# Patient Record
Sex: Male | Born: 1995 | Race: Black or African American | Hispanic: No | Marital: Single | Smoking: Former smoker
Health system: Southern US, Community
[De-identification: ages and names within clinical notes are randomized; demographics above are authoritative.]

## PROBLEM LIST (undated history)

## (undated) DIAGNOSIS — S62309A Unspecified fracture of unspecified metacarpal bone, initial encounter for closed fracture: Secondary | ICD-10-CM

## (undated) DIAGNOSIS — Z21 Asymptomatic human immunodeficiency virus [HIV] infection status: Secondary | ICD-10-CM

## (undated) DIAGNOSIS — B20 Human immunodeficiency virus [HIV] disease: Secondary | ICD-10-CM

## (undated) HISTORY — PX: WISDOM TOOTH EXTRACTION: SHX21

---
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
## 2014-01-24 ENCOUNTER — Emergency Department (HOSPITAL_COMMUNITY)
Admission: EM | Admit: 2014-01-24 | Discharge: 2014-01-24 | Disposition: A | Discharge status: Home / Self Care | Payer: BC Managed Care – PPO | Attending: Emergency Medicine | Admitting: Emergency Medicine

## 2014-01-24 ENCOUNTER — Encounter (HOSPITAL_COMMUNITY): Payer: Self-pay | Admitting: Emergency Medicine

## 2014-01-24 DIAGNOSIS — Z79899 Other long term (current) drug therapy: Secondary | ICD-10-CM | POA: Insufficient documentation

## 2014-01-24 DIAGNOSIS — Z791 Long term (current) use of non-steroidal anti-inflammatories (NSAID): Secondary | ICD-10-CM | POA: Insufficient documentation

## 2014-01-24 DIAGNOSIS — Z862 Personal history of diseases of the blood and blood-forming organs and certain disorders involving the immune mechanism: Secondary | ICD-10-CM | POA: Diagnosis not present

## 2014-01-24 DIAGNOSIS — Z21 Asymptomatic human immunodeficiency virus [HIV] infection status: Secondary | ICD-10-CM | POA: Insufficient documentation

## 2014-01-24 DIAGNOSIS — L0231 Cutaneous abscess of buttock: Secondary | ICD-10-CM | POA: Diagnosis present

## 2014-01-24 DIAGNOSIS — B20 Human immunodeficiency virus [HIV] disease: Secondary | ICD-10-CM

## 2014-01-24 DIAGNOSIS — L0501 Pilonidal cyst with abscess: Secondary | ICD-10-CM | POA: Insufficient documentation

## 2014-01-24 DIAGNOSIS — F17200 Nicotine dependence, unspecified, uncomplicated: Secondary | ICD-10-CM | POA: Diagnosis not present

## 2014-01-24 DIAGNOSIS — Z8639 Personal history of other endocrine, nutritional and metabolic disease: Secondary | ICD-10-CM | POA: Insufficient documentation

## 2014-01-24 DIAGNOSIS — L0591 Pilonidal cyst without abscess: Secondary | ICD-10-CM

## 2014-01-24 HISTORY — DX: Human immunodeficiency virus (HIV) disease: B20

## 2014-01-24 HISTORY — DX: Asymptomatic human immunodeficiency virus (hiv) infection status: Z21

## 2014-01-24 NOTE — ED Provider Notes (Signed)
CSN: [id number]     Arrival date & time 01/24/14  [id number] History   First MD Initiated Contact with Patient 01/24/14 [phone]     Chief Complaint  Patient presents with  . Abscess   Patient is a 18 y.o. male presenting with abscess. The history is provided by the patient. No language interpreter was used.  Abscess Location:  Ano-genital Ano-genital abscess location:  Gluteal cleft Size:  1 cm x 1.5 cm Abscess quality: painful   Abscess quality: not draining, no fluctuance, no induration, no itching, no redness, no warmth and not weeping   Red streaking: no   Duration:  2 days Progression:  Worsening Pain details:    Quality:  Pressure   Duration:  2 days   Timing:  Constant   Progression:  Worsening Chronicity:  New Context: immunosuppression   Context: not diabetes, not injected drug use, not insect bite/sting and not skin injury   Relieved by:  NSAIDs Worsened by:  Nothing tried Ineffective treatments:  NSAIDs Associated symptoms: no anorexia, no fatigue, no fever, no headaches, no nausea and no vomiting   Risk factors: no hx of MRSA and no prior abscess     Past Medical History  Diagnosis Date  . Immune deficiency disorder   . HIV (human immunodeficiency virus infection)    History reviewed. No pertinent past surgical history. No family history on file. History  Substance Use Topics  . Smoking status: Current Some Day Smoker    Types: Cigarettes  . Smokeless tobacco: Never Used     Comment: smoked due to stress   . Alcohol Use: 0.5 oz/week    1 drink(s) per week    Review of Systems  Constitutional: Negative for fever, chills, diaphoresis and fatigue.  Respiratory: Negative for chest tightness and shortness of breath.   Cardiovascular: Negative for chest pain and palpitations.  Gastrointestinal: Negative for nausea, vomiting, abdominal pain, diarrhea, constipation and anorexia.  Musculoskeletal: Negative for back pain.  Skin: Positive for wound. Negative for color  change, pallor and rash.  Neurological: Negative for headaches.  All other systems reviewed and are negative.     Allergies  Review of patient's allergies indicates no known allergies.  Home Medications   Prior to Admission medications   Medication Sig Start Date End Date Taking? Authorizing Provider  elvitegravir-cobicistat-emtricitabine-tenofovir (STRIBILD) 150-150-200-300 MG TABS tablet Take 1 tablet by mouth daily with breakfast. 05/01/13  Yes [name], MD  ketoconazole (NIZORAL) 2 % cream Apply 1 application topically daily.   Yes Historical Provider, MD  naproxen sodium (ANAPROX) 220 MG tablet Take 220 mg by mouth 2 (two) times daily with a meal.   Yes Historical Provider, MD   BP 130/67  Pulse 52  Temp(Src) 97.9 F (36.6 C) (Oral)  Resp 17  SpO2 100% Physical Exam  Nursing note and vitals reviewed. Constitutional: He is oriented to person, place, and time. He appears well-developed and well-nourished. No distress.  HENT:  Head: Normocephalic and atraumatic.  Mouth/Throat: Oropharynx is clear and moist. No oropharyngeal exudate.  Eyes: Conjunctivae are normal. Pupils are equal, round, and reactive to light. No scleral icterus.  Neck: Normal range of motion. Neck supple. No JVD present. No thyromegaly present.  Cardiovascular: Normal rate, regular rhythm, normal heart sounds and intact distal pulses.  Exam reveals no gallop and no friction rub.   No murmur heard. Pulmonary/Chest: Effort normal and breath sounds normal. No respiratory distress. He has no wheezes. He has no rales. He exhibits no  tenderness.  Abdominal: Soft. Bowel sounds are normal. He exhibits no distension and no mass. There is no tenderness. There is no rebound and no guarding.  Musculoskeletal: Normal range of motion.  Lymphadenopathy:    He has no cervical adenopathy.  Neurological: He is alert and oriented to person, place, and time.  Skin: Skin is warm and dry. No rash noted. He is not  diaphoretic.     Psychiatric: He has a normal mood and affect. His behavior is normal. Judgment and thought content normal.    ED Course  Procedures (including critical care time) Labs Review Labs Reviewed - No data to display  Imaging Review No results found.   EKG Interpretation None      MDM   Final diagnoses:  HIV (human immunodeficiency virus infection)  Pilonidal cyst   Patient presents with nodule to the top of the gluteal crest x 2 days.  Patient is immunocompromised with HIV positive status which is currently being treated with stribild by Dr. Drue SecondSnider.  Physical exam is not impressive for palpable fluctuance at this time; therefore, I have not performed an I&D.  Suspect that this is likely the start of a pilonidal abscess at this time. I have instructed the patient to use warm compresses as often as he can tolerate.  I have also told him that he can continue to take up to two tablets of aleve twice daily for pain relief with food.  He was instructed to follow-up with primary care to ensure clearance of this issue.  He was told to return if the area becomes hot red and warm, if he has streaking of the area, or if he develops fever with nausea and vomiting.  Patient states his understanding of the above plan.  Dr. Lavella LemonsManly agrees with the above plan.    Clydie Braunourtney A Forucci, PA-C 01/24/14 [phone]

## 2014-01-24 NOTE — ED Provider Notes (Signed)
Medical screening examination/treatment/procedure(s) were conducted as a shared visit with non-physician practitioner(s) and myself.  I personally evaluated the patient during the encounter.  Young, HIV positive man with slightly tender nodule just to right of the superior margin of gluteal cleft. Difficult to appreciate on exam. Perhaps a pilonidal cyst. Does not appear abscessed - no signifcant ttp, no overlying erythema, no fluctuance. No skin changes.   Agree with plan for warm compresses and outpatient f/u.    Brandt LoosenJulie Manly, MD 01/24/14 [phone]

## 2014-01-24 NOTE — Discharge Instructions (Signed)
Pilonidal Cyst A pilonidal cyst occurs when hairs get trapped (ingrown) beneath the skin in the crease between the buttocks over your sacrum (the bone under that crease). Pilonidal cysts are most common in young men with a lot of body hair. When the cyst is ruptured (breaks) or leaking, fluid from the cyst may cause burning and itching. If the cyst becomes infected, it causes a painful swelling filled with pus (abscess). The pus and trapped hairs need to be removed (often by lancing) so that the infection can heal. However, recurrence is common and an operation may be needed to remove the cyst. HOME CARE INSTRUCTIONS   If the cyst was NOT INFECTED:  Keep the area clean and dry. Bathe or shower daily. Wash the area well with a germ-killing soap. Warm tub baths may help prevent infection and help with drainage. Dry the area well with a towel.  Avoid tight clothing to keep area as moisture free as possible.  Keep area between buttocks as free of hair as possible. A depilatory may be used.  If the cyst WAS INFECTED and needed to be drained:  Your caregiver packed the wound with gauze to keep the wound open. This allows the wound to heal from the inside outwards and continue draining.  Return for a wound check in 1 day or as suggested.  If you take tub baths or showers, repack the wound with gauze following them. Sponge baths (at the sink) are a good alternative.  If an antibiotic was ordered to fight the infection, take as directed.  Only take over-the-counter or prescription medicines for pain, discomfort, or fever as directed by your caregiver.  After the drain is removed, use sitz baths for 20 minutes 4 times per day. Clean the wound gently with mild unscented soap, pat dry, and then apply a dry dressing. SEEK MEDICAL CARE IF:   You have increased pain, swelling, redness, drainage, or bleeding from the area.  You have a fever.  You have muscles aches, dizziness, or a general ill  feeling. Document Released: 07/09/2000 Document Revised: 10/04/2011 Document Reviewed: 09/06/2008 [employer] Patient Information 2015 [employer]. This information is not intended to replace advice given to you by your health care provider. Make sure you discuss any questions you have with your health care provider.  

## 2014-01-24 NOTE — ED Notes (Signed)
Pt coming from with a palpable, tender area to the sacrum.  No drainage with no heat and redness.

## 2014-01-25 ENCOUNTER — Encounter (HOSPITAL_COMMUNITY): Payer: Self-pay | Admitting: Emergency Medicine

## 2014-01-25 ENCOUNTER — Emergency Department (HOSPITAL_COMMUNITY)
Admission: EM | Admit: 2014-01-25 | Discharge: 2014-01-25 | Disposition: A | Discharge status: Home / Self Care | Payer: BC Managed Care – PPO | Attending: Emergency Medicine | Admitting: Emergency Medicine

## 2014-01-25 DIAGNOSIS — F17200 Nicotine dependence, unspecified, uncomplicated: Secondary | ICD-10-CM | POA: Insufficient documentation

## 2014-01-25 DIAGNOSIS — Z862 Personal history of diseases of the blood and blood-forming organs and certain disorders involving the immune mechanism: Secondary | ICD-10-CM | POA: Insufficient documentation

## 2014-01-25 DIAGNOSIS — Z79899 Other long term (current) drug therapy: Secondary | ICD-10-CM | POA: Insufficient documentation

## 2014-01-25 DIAGNOSIS — Z21 Asymptomatic human immunodeficiency virus [HIV] infection status: Secondary | ICD-10-CM | POA: Diagnosis not present

## 2014-01-25 DIAGNOSIS — Z791 Long term (current) use of non-steroidal anti-inflammatories (NSAID): Secondary | ICD-10-CM | POA: Diagnosis not present

## 2014-01-25 DIAGNOSIS — Z8639 Personal history of other endocrine, nutritional and metabolic disease: Secondary | ICD-10-CM | POA: Insufficient documentation

## 2014-01-25 DIAGNOSIS — L0231 Cutaneous abscess of buttock: Secondary | ICD-10-CM | POA: Insufficient documentation

## 2014-01-25 DIAGNOSIS — L0291 Cutaneous abscess, unspecified: Secondary | ICD-10-CM

## 2014-01-25 DIAGNOSIS — L03317 Cellulitis of buttock: Secondary | ICD-10-CM | POA: Diagnosis not present

## 2014-01-25 NOTE — Discharge Instructions (Signed)
Please report back to the ED within 2-3 days for wound to be re-assessed Please rest and stay hydrated Please apply warm compressions to the site to aid in continuous drainage Please continue to monitor symptoms closely and if symptoms are to worsen or change (fever greater than 101, chills, sweating, nausea, vomiting, chest pain, shortness of breath, difficulty breathing, numbness, tingling, swelling to the area, redness, hot to the touch, red streaks) please report back to the ED immediately    Abscess An abscess is an infected area that contains a collection of pus and debris.It can occur in almost any part of the body. An abscess is also known as a furuncle or boil. CAUSES  An abscess occurs when tissue gets infected. This can occur from blockage of oil or sweat glands, infection of hair follicles, or a minor injury to the skin. As the body tries to fight the infection, pus collects in the area and creates pressure under the skin. This pressure causes pain. People with weakened immune systems have difficulty fighting infections and get certain abscesses more often.  SYMPTOMS Usually an abscess develops on the skin and becomes a painful mass that is red, warm, and tender. If the abscess forms under the skin, you may feel a moveable soft area under the skin. Some abscesses break open (rupture) on their own, but most will continue to get worse without care. The infection can spread deeper into the body and eventually into the bloodstream, causing you to feel ill.  DIAGNOSIS  Your caregiver will take your medical history and perform a physical exam. A sample of fluid may also be taken from the abscess to determine what is causing your infection. TREATMENT  Your caregiver may prescribe antibiotic medicines to fight the infection. However, taking antibiotics alone usually does not cure an abscess. Your caregiver may need to make a small cut (incision) in the abscess to drain the pus. In some cases,  gauze is packed into the abscess to reduce pain and to continue draining the area. HOME CARE INSTRUCTIONS   Only take over-the-counter or prescription medicines for pain, discomfort, or fever as directed by your caregiver.  If you were prescribed antibiotics, take them as directed. Finish them even if you start to feel better.  If gauze is used, follow your caregiver's directions for changing the gauze.  To avoid spreading the infection:  Keep your draining abscess covered with a bandage.  Wash your hands well.  Do not share personal care items, towels, or whirlpools with others.  Avoid skin contact with others.  Keep your skin and clothes clean around the abscess.  Keep all follow-up appointments as directed by your caregiver. SEEK MEDICAL CARE IF:   You have increased pain, swelling, redness, fluid drainage, or bleeding.  You have muscle aches, chills, or a general ill feeling.  You have a fever. MAKE SURE YOU:   Understand these instructions.  Will watch your condition.  Will get help right away if you are not doing well or get worse. Document Released: 04/21/2005 Document Revised: 01/11/2012 Document Reviewed: 09/24/2011 [facility] Patient Information 2015 [place], [place]. This information is not intended to replace advice given to you by your health care provider. Make sure you discuss any questions you have with your health care provider.   Emergency Department Resource Guide 1) Find a Doctor and Pay Out of Pocket Although you won't have to find out who is covered by your insurance plan, it is a good idea to ask around  and get recommendations. You will then need to call the office and see if the doctor you have chosen will accept you as a new patient and what types of options they offer for patients who are self-pay. Some doctors offer discounts or will set up payment plans for their patients who do not have insurance, but you will need to ask so you aren't surprised  when you get to your appointment.  2) Contact Your Local Health Department Not all health departments have doctors that can see patients for sick visits, but many do, so it is worth a call to see if yours does. If you don't know where your local health department is, you can check in your phone book. The CDC also has a tool to help you locate your state's health department, and many state websites also have listings of all of their local health departments.  3) Find a Walk-in Clinic If your illness is not likely to be very severe or complicated, you may want to try a walk in clinic. These are popping up all over the country in pharmacies, drugstores, and shopping centers. They're usually staffed by nurse practitioners or physician assistants that have been trained to treat common illnesses and complaints. They're usually fairly quick and inexpensive. However, if you have serious medical issues or chronic medical problems, these are probably not your best option.  No Primary Care Doctor: - Call Health Connect at  [phone] - they can help you locate a primary care doctor that  accepts your insurance, provides certain services, etc. - Physician Referral Service- [phone]  Chronic Pain Problems: Organization         Address  Phone   Notes  [facility]  [phone] Patients need to be referred by their primary care doctor.   Medication Assistance: Organization         Address  Phone   Notes  [facility] Medication [facility] [address] [place], [place] [address] [phone] --Must be a resident of [facility] [place] -- Must have NO insurance coverage whatsoever (no Medicaid/ Medicare, etc.) -- The pt. MUST have a primary care doctor that directs their care regularly and follows them in the community   MedAssist  [phone]   [employer]  [phone]    Agencies that provide inexpensive medical care: Organization          Address  Phone   Notes  [facility]  [phone]   [facility]    [phone]   [facility] [address] [place], [place] [address] [phone]   [facility] [address], [place] [phone]   [facility]anned Parenthood    [phone]   [facility]    [phone]   [facility]  [address], [place] Phone:  [phone], Fax:  [phone] Hours of Operation:  9 am - 6 pm, M-F.  Also accepts Medicaid/Medicare and self-pay.  [facility]  [address], [place] Phone: [phone], Fax: [phone]. Hours of Operation:  8:30 am - 5:30 pm, M-F.  Also accepts Medicaid and self-pay.  [facility] [place] [address], [place] Phone: [phone]   [facility] [address], [place], [place] [phone], Ext. [phone] Mondays & Thursdays: 7-9 AM.  First 15 patients are seen on  a first come, first serve basis.    [place] Providers:  Organization         Address  Phone   Notes  [facility] [address], [place] [phone] Also accepts self-pay patients.  [facility] [address], [place]  [phone]   [place], [address], [place] [phone]   [facility] [address] [place] [phone]   Lucianne Lei [address], [place]   [phone] Only accepts [place] Access [place] patients after they have their name applied to their card.   Self-Pay (no insurance) in [facility]:  Organization         Address  Phone   Notes  Sickle Cell Patients, [facility] [place] [facility] [phone]   [facility] [place] [phone]    [facility] [place]  [place], [place], [place] [phone]   [facility]/Dr. Osei-Bonsu  [address], [place] or [address], [place] [phone] Phone number for both [place] and [place] locations is the same.  [facility]gent [facility] and [facility] [address] [place] [phone]   [facility] [address] [place] or [address] [phone]   [facility] [address] [place] [phone], phone; [phone], fax Sees patients 1st and 3rd Saturday of every month.  Must not qualify for public or private insurance (i.e. Medicaid, Medicare, [place] Health Choice, Veterans' Benefits)  Household income should be no more than 200% of the poverty level The clinic cannot treat you if you are pregnant or think you are pregnant  Sexually transmitted diseases are not treated at the clinic.    Dental Care: Organization         Address  Phone  Notes  [facility] Department of [facility] [place] [phone] Accepts children up to age 63 who are enrolled in [place] or [place]; pregnant women with a Medicaid card; and children who have applied for Medicaid or [place] Health Choice, but were declined, whose parents can pay a reduced fee at time of service.  [facility] Department of [facility]  [address], [place] [phone] Accepts children up to age 38 who are enrolled in [place] or [place]; pregnant women with a Medicaid card; and children who have applied for Medicaid or [place] Health Choice, but were declined, whose parents can pay a reduced fee at time of service.  [place] Adult [facility] Access PROGRAM  [place] [phone] Patients are seen by appointment only. Walk-ins are not accepted. [place] will see patients 54  years of age and older. Monday - Tuesday (8am-5pm) Most Wednesdays (8:30-5pm) $30 per visit, cash only  [facility] Adult Dental Access PROGRAM  [address], [facility] [phone] Patients are seen by appointment only. Walk-ins are not accepted. [place] will see patients 89 years of age and older. One Wednesday Evening (Monthly: Volunteer Based).  $30 per visit, cash only  [place]  [phone] for adults; Children under age 48, call Graduate Pediatric Dentistry at [phone]. Children  aged 32-14, please call [phone] to request a pediatric application.  Dental services are provided in all areas of dental care including fillings, crowns and bridges, complete and partial dentures, implants, gum treatment, root canals, and extractions. Preventive care is also provided. Treatment is provided to both adults and children. Patients are selected via a lottery and there is often a waiting list.   [facility] [address] [place]  [phone] [email or web address]   [facility] [address] [place], [place] [phone], Ext. [phone] Second and Fourth Thursday of each month, opens at 6:30 AM; Clinic ends at 9 AM.  Patients are seen on a first-come first-served basis, and a limited number are seen during each clinic.   [facility]  [address] Hillard Danker [place], [place] [phone]   Eligibility Requirements You must have lived in [place], [place], or [place] counties for at least the last three months.   You cannot be eligible for state or federal sponsored [employer], including [employer], [place], or [employer].   You generally cannot be eligible for healthcare insurance through your employer.    How to apply: Eligibility screenings are held every Tuesday and Wednesday afternoon from 1:00 pm until 4:00 pm. You do not need an appointment for the interview!  [facility]  [address] [place], [place]   [place]  [place] Department  [place]  [phone]    Behavioral Health Resources in the Community: Intensive Outpatient Programs Organization         Address  Phone  Notes  [facility]. [address], [place], [place] [phone]   [facility] [address] [place], [place]   ADS: Alcohol & Drug Svcs [address], [place], [place]   [place] [address]  [place], [place] or [phone]   Substance Abuse Resources Organization         Address  Phone  Notes  Alcohol and Drug Services  [phone]   [place]  [phone]   The [place]   Chinita Pester  [phone]   Residential & Outpatient Substance Abuse Program  [phone]   Psychological Services Organization         Address  Phone  Notes  [facility] [place]  [place]  [phone]   [place] [address], [place] or [phone]    Mobile Crisis Teams Organization         Address  Phone  Notes  [facility], Mobile Crisis Care Unit  [phone]   [facility]  [address] [place], [place]   [place] Levels [address], [place] [phone]    Self-Help/Support Groups Organization         Address  Phone             Notes  [place]. of [place] - variety of support groups  [place] Call for more information  Narcotics Anonymous (NA), [facility] [address], [place]  2 meetings at this location   Special educational needs teacher         Address  Phone  Notes  ASAP Residential Treatment [place],    [place]   [phone]  [facility]  [address] [place] [address], [place], [place]   [place], [place] [phone] Admissions: 8am-3pm M-F  Incentives Substance [place] [address]    [place], [place] [phone]   [facility] [address] [place], [place], [place]   [facility] [address]  [place], [place]   [facility]ight Programs - Intensive Outpatient [address], [place], [place]   [facility] ([place].) [address].,  [place], [place] [phone] or [phone]   Residential Treatment Services (RTS) [address], [place], [place] Accepts Medicaid  [facility] [address],  [place] [phone] Substance Abuse/Addiction Treatment   [facility] Organization         Address  Phone  Notes  [facility]  [phone]   Domenic Schwab, PhD [address] Arlis Porta [place], [place]   [phone] or [phone]   [place], [place] [phone]   [facility] [address] [place], [place] [phone] Insurance/Medicaid/sponsorship through [facility] and Families [address] [place] [employer]                                    [place], [place] [phone] [place] [address] [place], [place] [phone]    Dr. Adele Schilder  [phone]   [facility] Dept. 1) [address], [place] 2) [place] 3)  [place] 65, [place] [phone]  [phone]   [place] [phone] or [phone] (After Hours)

## 2014-01-25 NOTE — ED Provider Notes (Signed)
CSN: [id number]     Arrival date & time 01/25/14  1944 History  This chart was scribed Raymon MuttonMarissa Arushi Partridge, PA-C, working with Audree CamelScott T Goldston, MD by Leona CarryG. Clay Sherrill, ED Scribe. The patient was seen in TR11C/TR11C. The patient's care was started at 8:24 PM.     Chief Complaint  Patient presents with  . Recurrent Skin Infections    The history is provided by the patient. No language interpreter was used.   HPI Comments: Gary Pena is a 18 y.o. male with a history of HIV who presents to the Emergency Department complaining of a "boil" on his buttocks beginning 5 days ago. He reports associated throbbing pain. Patient visited the ED 4 days ago for the same problem, but did not receive any medications. He denies drainage, SOB, CP, fever, nausea, vomiting. Patient takes Stribild and Nizoral daily. He reports that he has taken his medications regularly.   Patient does not have a PCP.   Past Medical History  Diagnosis Date  . Immune deficiency disorder   . HIV (human immunodeficiency virus infection)    History reviewed. No pertinent past surgical history. No family history on file. History  Substance Use Topics  . Smoking status: Current Some Day Smoker    Types: Cigarettes  . Smokeless tobacco: Never Used     Comment: smoked due to stress   . Alcohol Use: 0.5 oz/week    1 drink(s) per week    Review of Systems  Constitutional: Negative for fever.  Respiratory: Negative for shortness of breath.   Cardiovascular: Negative for chest pain.  Gastrointestinal: Negative for nausea and vomiting.  Skin: Positive for wound (abscess).      Allergies  Review of patient's allergies indicates no known allergies.  Home Medications   Prior to Admission medications   Medication Sig Start Date End Date Taking? Authorizing Provider  elvitegravir-cobicistat-emtricitabine-tenofovir (STRIBILD) 150-150-200-300 MG TABS tablet Take 1 tablet by mouth daily with breakfast. 05/01/13  Yes Judyann Munsonynthia  Snider, MD  ketoconazole (NIZORAL) 2 % cream Apply 1 application topically daily.   Yes Historical Provider, MD  naproxen sodium (ANAPROX) 220 MG tablet Take 220 mg by mouth 2 (two) times daily with a meal.   Yes Historical Provider, MD   Triage Vitals: BP 138/62  Pulse 88  Temp(Src) 98.7 F (37.1 C) (Oral)  Resp 14  SpO2 98% Physical Exam  Nursing note and vitals reviewed. Constitutional: He is oriented to person, place, and time. He appears well-developed and well-nourished. No distress.  HENT:  Head: Normocephalic and atraumatic.  Mouth/Throat: Oropharynx is clear and moist. No oropharyngeal exudate.  Eyes: Conjunctivae and EOM are normal. Pupils are equal, round, and reactive to light. Right eye exhibits no discharge. Left eye exhibits no discharge.  Neck: Normal range of motion. Neck supple. No tracheal deviation present.  Cardiovascular: Normal rate, regular rhythm and normal heart sounds.  Exam reveals no friction rub.   No murmur heard. Pulses:      Radial pulses are 2+ on the right side, and 2+ on the left side.  Pulmonary/Chest: Effort normal and breath sounds normal. No respiratory distress. He has no wheezes. He has no rales.  Musculoskeletal: Normal range of motion.  Full ROM to upper and lower extremities without difficulty noted, negative ataxia noted.  Lymphadenopathy:    He has no cervical adenopathy.  Neurological: He is alert and oriented to person, place, and time. No cranial nerve deficit. He exhibits normal muscle tone. Coordination normal.  Skin: Skin  is warm and dry. No rash noted. He is not diaphoretic. No erythema.     Approximately 2 cm x 2 cm indurated abscess identified to the superior gluteal fold. Negative findings of cellulitic infection. Pustule noted on top.    ED Course  Procedures (including critical care time) DIAGNOSTIC STUDIES: Oxygen Saturation is 98% on room air, normal by my interpretation.     INCISION AND DRAINAGE Performed by:  Raymon MuttonSciacca, Vonnetta Akey Consent: Verbal consent obtained. Risks and benefits: risks, benefits and alternatives were discussed Type: abscess Body area: superior gluteal fold Anesthesia: local infiltration Incision was made with a scalpel. Local anesthetic: lidocaine 2% without epinephrine Anesthetic total: 5 ml Complexity: complex Blunt dissection to break up loculations Drainage: purulent Drainage amount: 10 cc Packing material: 1/4 in iodoform gauze Patient tolerance: Patient tolerated the procedure well with no immediate complications.    Labs Review Labs Reviewed - No data to display  Imaging Review No results found.   EKG Interpretation None      MDM   Final diagnoses:  Abscess    Filed Vitals:   01/25/14 1949  BP: 138/62  Pulse: 88  Temp: 98.7 F (37.1 C)  TempSrc: Oral  Resp: 14  SpO2: 98%   I personally performed the services described in this documentation, which was scribed in my presence. The recorded information has been reviewed and is accurate.   This provider reviewed patient's chart. Patient was last seen and assessed in ED setting on 01/24/2014 regarding abscess to buttocks. Patient was discharged home with no I&D performed or antibiotics. Patient presenting to the ED with superior gluteal fold abscess with induration with a pustule at the top. Induration and discomfort upon palpation.  I&D performed the superior gluteal fold abscess with purulent fluid drainage. Approximately 10 cc of purulent fluid drained. Packing placed. Patient tolerated procedure well. Patient stable, afebrile. Patient nontoxic appearing. Patient septic. Discharged patient. Discussed with patient to rest. Discussed proper hygiene. Discussed with patient that he needs to be seen within the ED setting with an approximately 2-3 days in order for site to be reassessed. Discussed with patient to closely monitor symptoms and if symptoms are to worsen or change to report back to the ED -  strict return instructions given.  Patient agreed to plan of care, understood, all questions answered.   Raymon MuttonMarissa Renard Caperton, PA-C 01/26/14 [phone]

## 2014-01-25 NOTE — ED Notes (Signed)
Pt verbalizes understanding of d/c instructions and denies any further needs at this time. 

## 2014-01-25 NOTE — ED Notes (Signed)
The pt has had a boil on his buttocks for approx one week.  He was seen here for the same on Monday and told to return if it started getting worse.  The pain has increased

## 2014-01-27 ENCOUNTER — Encounter (HOSPITAL_COMMUNITY): Payer: Self-pay | Admitting: Emergency Medicine

## 2014-01-27 ENCOUNTER — Emergency Department (HOSPITAL_COMMUNITY)
Admission: EM | Admit: 2014-01-27 | Discharge: 2014-01-27 | Discharge status: Against Medical Advice | Payer: BC Managed Care – PPO | Attending: Emergency Medicine | Admitting: Emergency Medicine

## 2014-01-27 DIAGNOSIS — F17200 Nicotine dependence, unspecified, uncomplicated: Secondary | ICD-10-CM | POA: Diagnosis not present

## 2014-01-27 DIAGNOSIS — Z4801 Encounter for change or removal of surgical wound dressing: Secondary | ICD-10-CM | POA: Diagnosis present

## 2014-01-27 NOTE — ED Notes (Signed)
States that he does not want to wait. Will return in the morning. Encouraged patient to wait. States that he still waits to leave.

## 2014-01-27 NOTE — ED Notes (Signed)
The pt was here 2-3 days ago for an abscess that was drained.  He was told to return here for a wound check.  No temp no pain since  The  procedure

## 2014-01-28 ENCOUNTER — Emergency Department (HOSPITAL_COMMUNITY)
Admission: EM | Admit: 2014-01-28 | Discharge: 2014-01-28 | Disposition: A | Discharge status: Home / Self Care | Payer: BC Managed Care – PPO | Attending: Emergency Medicine | Admitting: Emergency Medicine

## 2014-01-28 ENCOUNTER — Encounter (HOSPITAL_COMMUNITY): Payer: Self-pay | Admitting: Emergency Medicine

## 2014-01-28 DIAGNOSIS — Z21 Asymptomatic human immunodeficiency virus [HIV] infection status: Secondary | ICD-10-CM | POA: Diagnosis not present

## 2014-01-28 DIAGNOSIS — D849 Immunodeficiency, unspecified: Secondary | ICD-10-CM | POA: Diagnosis not present

## 2014-01-28 DIAGNOSIS — Z4800 Encounter for change or removal of nonsurgical wound dressing: Secondary | ICD-10-CM | POA: Diagnosis present

## 2014-01-28 DIAGNOSIS — F17200 Nicotine dependence, unspecified, uncomplicated: Secondary | ICD-10-CM | POA: Diagnosis not present

## 2014-01-28 DIAGNOSIS — L0501 Pilonidal cyst with abscess: Secondary | ICD-10-CM | POA: Insufficient documentation

## 2014-01-28 NOTE — ED Provider Notes (Signed)
Medical screening examination/treatment/procedure(s) were performed by non-physician practitioner and as supervising physician I was immediately available for consultation/collaboration.   EKG Interpretation None        Rebeccah Ivins T Laykin Rainone, MD 01/28/14 1152 

## 2014-01-28 NOTE — ED Notes (Signed)
PT returns today for recheck of wound that was I& D on 7-3.

## 2014-01-28 NOTE — Discharge Instructions (Signed)
Please read and follow all provided instructions.  Your diagnoses today include:  1. Pilonidal abscess     Tests performed today include:  Vital signs. See below for your results today.   Medications prescribed:   None  Take any prescribed medications only as directed.   Home care instructions:   Follow any educational materials contained in this packet  Follow-up instructions: Return to the Emergency Department in 48 hours for a recheck if your symptoms are not significantly improved.  Please follow-up with your primary care provider in the next 1 week for further evaluation of your symptoms.   Return instructions:  Return to the Emergency Department if you have:  Fever  Worsening symptoms  Worsening pain  Worsening swelling  Redness of the skin that moves away from the affected area, especially if it streaks away from the affected area   Any other emergent concerns    Your vital signs today were: BP 108/76   Pulse 58   Temp(Src) 98 F (36.7 C) (Oral)   Resp 18   Ht 5\' 11"  (1.803 m)   Wt 200 lb (90.719 kg)   BMI 27.91 kg/m2   SpO2 100% If your blood pressure (BP) was elevated above 135/85 this visit, please have this repeated by your doctor within one month. --------------

## 2014-01-28 NOTE — ED Provider Notes (Signed)
CSN: [id number]     Arrival date & time 01/28/14  0826 History   First MD Initiated Contact with Patient 01/28/14 [phone]     Chief Complaint  Patient presents with  . Wound Check     (Consider location/radiation/quality/duration/timing/severity/associated sxs/prior Treatment) HPI Comments: Patient presents to the emergency department for recheck of an abscess that was drained on 07/03. Patient is HIV positive on antiretroviral medication. He was not started on antibiotics prior. Patient states that the area is much improved. He still has a minimal amount of drainage. He's been allowing warm water to drain over the area but has not been doing warm soaks. No fever, nausea or vomiting. Pain is mild. The onset of this condition was acute. The course is constant. Aggravating factors: none. Alleviating factors: none.    Patient is a 18 y.o. male presenting with wound check. The history is provided by the patient.  Wound Check Pertinent negatives include no fever, nausea or vomiting.    Past Medical History  Diagnosis Date  . Immune deficiency disorder   . HIV (human immunodeficiency virus infection)   . Skin abscess     01-25-14  recheck on 01-28-14   History reviewed. No pertinent past surgical history. History reviewed. No pertinent family history. History  Substance Use Topics  . Smoking status: Current Some Day Smoker    Types: Cigarettes  . Smokeless tobacco: Never Used     Comment: smoked due to stress   . Alcohol Use: 0.5 oz/week    1 drink(s) per week    Review of Systems  Constitutional: Negative for fever.  Gastrointestinal: Negative for nausea and vomiting.  Skin: Negative for color change.       Positive for abscess  Hematological: Negative for adenopathy.      Allergies  Review of patient's allergies indicates no known allergies.  Home Medications   Prior to Admission medications   Medication Sig Start Date End Date Taking? Authorizing Provider   elvitegravir-cobicistat-emtricitabine-tenofovir (STRIBILD) 150-150-200-300 MG TABS tablet Take 1 tablet by mouth daily with breakfast.   Yes Historical Provider, MD  ketoconazole (NIZORAL) 2 % cream Apply 1 application topically daily.   Yes Historical Provider, MD  naproxen sodium (ANAPROX) 220 MG tablet Take 220 mg by mouth 2 (two) times daily with a meal.   Yes Historical Provider, MD   BP 108/76  Pulse 58  Temp(Src) 98 F (36.7 C) (Oral)  Resp 18  Ht 5\' 11"  (1.803 m)  Wt 200 lb (90.719 kg)  BMI 27.91 kg/m2  SpO2 100%  Physical Exam  Nursing note and vitals reviewed. Constitutional: He appears well-developed and well-nourished.  HENT:  Head: Normocephalic and atraumatic.  Eyes: Conjunctivae are normal.  Neck: Normal range of motion. Neck supple.  Pulmonary/Chest: No respiratory distress.  Neurological: He is alert.  Skin: Skin is warm and dry.  Healing pilonidal abscess, minimal drainage at time of packing removal. No surrounding cellulitis. Appears to be healing well.  Psychiatric: He has a normal mood and affect.    ED Course  Procedures (including critical care time) Labs Review Labs Reviewed - No data to display  Imaging Review No results found.   EKG Interpretation None      8:53 AM Patient seen and examined. Packing removed. Small amount of purulent drainage noted. Appears to be healing well.    Vital signs reviewed and are as follows: Filed Vitals:   01/28/14 0834  BP: 108/76  Pulse: 58  Temp: 98 F (36.7  C)  Resp: 18   The patient was urged to return to the Emergency Department urgently with worsening pain, swelling, expanding erythema especially if it streaks away from the affected area, fever, or if they have any other concerns.   The patient was urged to return to the Emergency Department or go to their PCP in 48 hours for wound recheck if the area is not significantly improved.  The patient verbalized understanding and stated agreement with  this plan.    MDM   Final diagnoses:  Pilonidal abscess   Healing pilonidal abscess, packing removed. Pt feeling better. No fever. Pt is HIV positive -- I don't see where he was started on antibiotics. I would not start them at this time because abscess is improving well without treatment. Return instructions given.     Renne CriglerJoshua Trase Bunda, PA-C 01/28/14 [phone]

## 2014-01-28 NOTE — ED Notes (Signed)
Pt was seen here on 7/3 for abscess to buttock. Had I&D with packing. Packing still in place. Pt feels he is "much better". Purulent drainage noted from buttock wound.

## 2014-01-28 NOTE — ED Provider Notes (Signed)
Medical screening examination/treatment/procedure(s) were performed by non-physician practitioner and as supervising physician I was immediately available for consultation/collaboration.   EKG Interpretation None        Enid SkeensJoshua M Rashema Seawright, MD 01/28/14 [phone]

## 2014-02-04 NOTE — ED Provider Notes (Signed)
Medical screening examination/treatment/procedure(s) were performed by non-physician practitioner and as supervising physician I was immediately available for consultation/collaboration.   EKG Interpretation None        Surah Pelley, MD 02/04/14 0549 

## 2014-03-18 ENCOUNTER — Emergency Department (HOSPITAL_COMMUNITY)
Admission: EM | Admit: 2014-03-18 | Discharge: 2014-03-18 | Disposition: A | Discharge status: Home / Self Care | Payer: BC Managed Care – PPO | Attending: Emergency Medicine | Admitting: Emergency Medicine

## 2014-03-18 ENCOUNTER — Encounter (HOSPITAL_COMMUNITY): Payer: Self-pay | Admitting: Emergency Medicine

## 2014-03-18 DIAGNOSIS — L0501 Pilonidal cyst with abscess: Secondary | ICD-10-CM

## 2014-03-18 DIAGNOSIS — Z21 Asymptomatic human immunodeficiency virus [HIV] infection status: Secondary | ICD-10-CM | POA: Insufficient documentation

## 2014-03-18 DIAGNOSIS — Z79899 Other long term (current) drug therapy: Secondary | ICD-10-CM | POA: Diagnosis not present

## 2014-03-18 DIAGNOSIS — Z8639 Personal history of other endocrine, nutritional and metabolic disease: Secondary | ICD-10-CM | POA: Insufficient documentation

## 2014-03-18 DIAGNOSIS — Z862 Personal history of diseases of the blood and blood-forming organs and certain disorders involving the immune mechanism: Secondary | ICD-10-CM | POA: Insufficient documentation

## 2014-03-18 DIAGNOSIS — F17200 Nicotine dependence, unspecified, uncomplicated: Secondary | ICD-10-CM | POA: Insufficient documentation

## 2014-03-18 MED ORDER — SEPTRA DS 800-160 MG PO TABS: 1.0000 | ORAL_TABLET | Freq: Two times a day (BID) | ORAL | Status: DC

## 2014-03-18 NOTE — ED Provider Notes (Signed)
Medical screening examination/treatment/procedure(s) were performed by non-physician practitioner and as supervising physician I was immediately available for consultation/collaboration.   EKG Interpretation None        Petra Sargeant MLyanne Co 03/18/14 [phone]

## 2014-03-18 NOTE — Discharge Instructions (Signed)
You were seen and treated for your abscess infection. This was drained through a small incision. Keep the area clean and dry. Use warm compresses to help with infection. Have a recheck of your infection in 2 days. Followup with a primary care provider.   Pilonidal Cyst, Care After A pilonidal cyst occurs when hairs get trapped (ingrown) beneath the skin in the crease between the buttocks over your sacrum (the bone under that crease). Pilonidal cysts are most common in young men with a lot of body hair. When the cyst breaks(ruptured) or leaks, fluid from the cyst may cause burning and itching. If the cyst becomes infected, it causes a painful swelling filled with pus (abscess). The pus and trapped hairs need to be removed (often by lancing) so that the infection can heal. The word pilonidal means hair nest. HOME CARE INSTRUCTIONS If the pilonidal sinus was NOT DRAINING OR LANCED:  Keep the area clean and dry. Bathe or shower daily. Wash the area well with a germ-killing soap. Hot tub baths may help prevent infection. Dry the area well with a towel.  Avoid tight clothing in order to keep area as moisture-free as possible.  Keep area between buttocks as free from hair as possible. A depilatory may be used.  Take antibiotics as directed.  Only take over-the-counter or prescription medicines for pain, discomfort, or fever as directed by your caregiver. If the cyst WAS INFECTED AND NEEDED TO BE DRAINED:  Your caregiver may have packed the wound with gauze to keep the wound open. This allows the wound to heal from the inside outward and continue to drain.  Return as directed for a wound check.  If you take tub baths or showers, repack the wound with gauze as directed following. Sponge baths are a good alternative. Sitz baths may be used three to four times a day or as directed.  If an antibiotic was ordered to fight the infection, take as directed.  Only take over-the-counter or prescription  medicines for pain, discomfort, or fever as directed by your caregiver.  If a drain was in place and removed, use sitz baths for 20 minutes 4 times per day. Clean the wound gently with mild unscented soap, pat dry, and then apply a dry dressing as directed. If you had surgery and IT WAS MARSUPIALIZED (LEFT OPEN):  Your wound was packed with gauze to keep the wound open. This allows the wound to heal from the inside outwards and continue draining. The changing of the dressing regularly also helps keep the wound clean.  Return as directed for a wound check.  If you take tub baths or showers, repack the wound with gauze as directed following. Sponge baths are a good alternative. Sitz baths can also be used. This may be done three to four times a day or as directed.  If an antibiotic was ordered to fight the infection, take as directed.  Only take over-the-counter or prescription medicines for pain, discomfort, or fever as directed by your caregiver.  If you had surgery and the wound was closed you may care for it as directed. This generally includes keeping it dry and clean and dressing it as directed. SEEK MEDICAL CARE IF:   You have increased pain, swelling, redness, drainage, or bleeding from the area.  You have a fever.  You have muscles aches, dizziness, or a general ill feeling. Document Released: 08/12/2006 Document Revised: 03/14/2013 Document Reviewed: 10/27/2006 [facility] Patient Information 2015 [place], [place]. This information is not intended  to replace advice given to you by your health care provider. Make sure you discuss any questions you have with your health care provider. ° °

## 2014-03-18 NOTE — ED Provider Notes (Signed)
CSN: [id number]     Arrival date & time 03/18/14  0300 History   First MD Initiated Contact with Patient 03/18/14 [phone]     Chief Complaint  Patient presents with  . Abscess   HPI  History provided by the patient. The patient is an 18 year old male with history of HIV presenting with concerns for boil the skin. Patient reports noticing worsening pain swelling to the pilonidal area over the past 4-5 days. Symptoms are similar to recent abscess infection 1-1/2 months ago. This was treated with I&D with good improvement until recent symptoms returned. There is no associated fever, chills or sweats. No bleeding or drainage. Patient has been using warm soaks to help symptoms without significant change. No other aggravating or alleviating factors. No other associated symptoms.    Past Medical History  Diagnosis Date  . Immune deficiency disorder   . HIV (human immunodeficiency virus infection)   . Skin abscess     01-25-14  recheck on 01-28-14   History reviewed. No pertinent past surgical history. No family history on file. History  Substance Use Topics  . Smoking status: Current Some Day Smoker    Types: Cigarettes  . Smokeless tobacco: Never Used     Comment: smoked due to stress   . Alcohol Use: No    Review of Systems  Constitutional: Negative for fever, chills and diaphoresis.  All other systems reviewed and are negative.     Allergies  Review of patient's allergies indicates no known allergies.  Home Medications   Prior to Admission medications   Medication Sig Start Date End Date Taking? Authorizing Provider  elvitegravir-cobicistat-emtricitabine-tenofovir (STRIBILD) 150-150-200-300 MG TABS tablet Take 1 tablet by mouth daily with breakfast.   Yes Historical Provider, MD  ketoconazole (NIZORAL) 2 % cream Apply 1 application topically daily.   Yes Historical Provider, MD   BP 156/83  Pulse 58  Temp(Src) 98 F (36.7 C) (Oral)  Resp 14  Ht  (1.778 m)  Wt 190 lb  (86.183 kg)  BMI 27.26 kg/m2  SpO2 100% Physical Exam  Nursing note and vitals reviewed. Constitutional: He is oriented to person, place, and time. He appears well-developed and well-nourished.  HENT:  Head: Normocephalic.  Cardiovascular: Normal rate and regular rhythm.   Pulmonary/Chest: Effort normal and breath sounds normal. No respiratory distress.  Neurological: He is alert and oriented to person, place, and time.  Skin: Skin is warm.  Nodular swelling to the pilonidal and right buttocks area with induration and fluctuance. Slight purulent drainage present. Area is tender to palpation.    ED Course  Procedures   COORDINATION OF CARE:  Nursing notes reviewed. Vital signs reviewed. Initial pt interview and examination performed.   Filed Vitals:   03/18/14 0314 03/18/14 0342  BP: 156/83   Pulse: 58   Temp: 98 F (36.7 C)   TempSrc: Oral   Resp: 14   Height:  (1.778 m)   Weight: 190 lb (86.183 kg)   SpO2: 100% 100%    4:34 AM-patient seen and evaluated. Patient well appearing afebrile. He does not appear severely ill or toxic.   INCISION AND DRAINAGE Performed by: Angus Seller Consent: Verbal consent obtained. Risks and benefits: risks, benefits and alternatives were discussed Type: abscess  Body area: Pilonidal abscess  Anesthesia: local infiltration  Incision was made with a scalpel.  Local anesthetic: lidocaine 2% with epinephrine  Anesthetic total: 2 ml  Complexity: complex Blunt dissection to break up loculations  Drainage: purulent  Drainage amount: Moderate   Packing material: None   Patient tolerance: Patient tolerated the procedure well with no immediate complications.       MDM   Final diagnoses:  Pilonidal abscess        Angus Seller, PA-C 03/18/14 [phone]

## 2014-03-18 NOTE — ED Notes (Signed)
Pt arrives with c/o returning abscess, was seen in July for same. Pain 10/10, aching and tight. Can't lie on back anymore.

## 2014-03-28 ENCOUNTER — Encounter (HOSPITAL_COMMUNITY): Payer: Self-pay | Admitting: Emergency Medicine

## 2014-03-28 ENCOUNTER — Emergency Department (INDEPENDENT_AMBULATORY_CARE_PROVIDER_SITE_OTHER)
Admission: EM | Admit: 2014-03-28 | Discharge: 2014-03-28 | Disposition: A | Discharge status: Home / Self Care | Payer: BC Managed Care – PPO | Source: Home / Self Care | Attending: Family Medicine | Admitting: Family Medicine

## 2014-03-28 DIAGNOSIS — R519 Headache, unspecified: Secondary | ICD-10-CM

## 2014-03-28 DIAGNOSIS — R51 Headache: Secondary | ICD-10-CM

## 2014-03-28 MED ORDER — BUTALBITAL-APAP-CAFF-COD 50-325-40-30 MG PO CAPS: 1.0000 | ORAL_CAPSULE | ORAL | Status: DC | PRN

## 2014-03-28 NOTE — ED Notes (Signed)
C/o headache.  Throbbing pain to right side of head.  Pain comes and goes.  Denies n/v and visual changes.  Mild relief with advil.   On set yesterday.

## 2014-03-28 NOTE — ED Provider Notes (Signed)
CSN: [id number]     Arrival date & time 03/28/14  1847 History   First MD Initiated Contact with Patient 03/28/14 1907     Chief Complaint  Patient presents with  . Headache   (Consider location/radiation/quality/duration/timing/severity/associated sxs/prior Treatment) Patient is a 18 y.o. male presenting with headaches. The history is provided by the patient.  Headache Pain location:  R temporal and frontal Quality: throbbing. Radiates to:  Does not radiate Severity currently:  7/10 Onset quality:  Gradual Duration:  2 days Timing:  Intermittent Chronicity:  New Relieved by:  NSAIDs (advil helps for a while but then comes back) Worsened by:  Nothing tried Associated symptoms: no abdominal pain, no blurred vision, no congestion, no cough, no ear pain, no facial pain, no fever, no nausea, no near-syncope, no neck stiffness, no paresthesias, no photophobia, no sinus pressure, no sore throat and no vomiting    Gary Pena is a 18 y.o. male who [facility] to the [facility] with a headache. The headache started 2 days ago on the right side at the temporal area and right forehead. It gets better with Advil but then comes back.  PMH significant for HIV  Past Medical History  Diagnosis Date  . Immune deficiency disorder   . HIV (human immunodeficiency virus infection)   . Skin abscess     01-25-14  recheck on 01-28-14   History reviewed. No pertinent past surgical history. History reviewed. No pertinent family history. History  Substance Use Topics  . Smoking status: Current Some Day Smoker    Types: Cigarettes  . Smokeless tobacco: Never Used     Comment: smoked due to stress   . Alcohol Use: No    Review of Systems  Constitutional: Negative for fever.  HENT: Negative for congestion, ear pain, rhinorrhea, sinus pressure and sore throat.   Eyes: Negative for blurred vision and photophobia.  Respiratory: Negative for cough.   Cardiovascular: Negative for near-syncope.  Gastrointestinal:  Negative for nausea, vomiting and abdominal pain.  Musculoskeletal: Negative for gait problem and neck stiffness.  Neurological: Positive for headaches. Negative for syncope and paresthesias.  Psychiatric/Behavioral: Negative for confusion. The patient is not nervous/anxious.     Allergies  Review of patient's allergies indicates no known allergies.  Home Medications   Prior to Admission medications   Medication Sig Start Date End Date Taking? Authorizing Provider  elvitegravir-cobicistat-emtricitabine-tenofovir (STRIBILD) 150-150-200-300 MG TABS tablet Take 1 tablet by mouth daily with breakfast.   Yes Historical Provider, MD  butalbital-acetaminophen-caffeine (FIORICET/CODEINE) 50-325-40-30 MG per capsule Take 1 capsule by mouth every 4 (four) hours as needed for headache. 03/28/14   Hope Orlene Och, NP  ketoconazole (NIZORAL) 2 % cream Apply 1 application topically daily.    Historical Provider, MD  SEPTRA DS 800-160 MG per tablet Take 1 tablet by mouth 2 (two) times daily. 03/18/14   Angus Seller, PA-C    Physical Exam  Constitutional: He is oriented to person, place, and time. He appears well-developed and well-nourished. No distress.  HENT:  Head: Normocephalic and atraumatic.  Right Ear: Tympanic membrane normal.  Left Ear: Tympanic membrane normal.  Nose: Nose normal.  Mouth/Throat: Uvula is midline, oropharynx is clear and moist and mucous membranes are normal.  No TMJ tenderness  Eyes: Conjunctivae and EOM are normal. Pupils are equal, round, and reactive to light.  Neck: Normal range of motion. Neck supple.  Cardiovascular: Normal rate and regular rhythm.   Pulmonary/Chest: Effort normal. He has no wheezes. He has no  rales.  Abdominal: Soft. Bowel sounds are normal. He exhibits no mass. There is no tenderness.  Musculoskeletal: He exhibits no edema.  Radial and pedal pulses strong, adequate circulation, good touch sensation.  Neurological: He is alert and oriented to person,  place, and time. He has normal strength. No cranial nerve deficit or sensory deficit. He displays a negative Romberg sign. Gait normal.  Reflex Scores:      Bicep reflexes are 2+ on the right side and 2+ on the left side.      Brachioradialis reflexes are 2+ on the right side and 2+ on the left side.      Patellar reflexes are 2+ on the right side and 2+ on the left side.      Achilles reflexes are 2+ on the right side and 2+ on the left side. Rapid alternating movement without difficulty. Stands on one foot without difficulty.  Psychiatric: He has a normal mood and affect. His behavior is normal.    ED Course  Procedures   MDM  I have reviewed this patient's vital signs, nurses notes, appropriate labs from prior visits and discussed clinical findings and plan of care with the patient. He voices understanding.  Stable for discharge without neuro deficits.  Patient request work note for work missed today.    Medication List    TAKE these medications       butalbital-acetaminophen-caffeine 50-325-40-30 MG per capsule  Commonly known as:  FIORICET/CODEINE  Take 1 capsule by mouth every 4 (four) hours as needed for headache.      ASK your doctor about these medications       ketoconazole 2 % cream  Commonly known as:  NIZORAL  Apply 1 application topically daily.     SEPTRA DS 800-160 MG per tablet  Generic drug:  sulfamethoxazole-trimethoprim  Take 1 tablet by mouth 2 (two) times daily.     STRIBILD 150-150-200-300 MG Tabs tablet  Generic drug:  elvitegravir-cobicistat-emtricitabine-tenofovir  Take 1 tablet by mouth daily with breakfast.           Janne Napoleon, NP 03/28/14 1938   Patient with headache.  HIV not likely a factor given undecidable viral load and normal CD4 count in May.  Medications as above.   Medical screening examination/treatment/procedure(s) were performed by a resident physician or non-physician practitioner and as the supervising physician I was  immediately available for consultation/collaboration.  Clementeen Graham, MD   Rodolph Bong, MD 03/29/14 [phone]

## 2014-04-17 ENCOUNTER — Other Ambulatory Visit: Payer: BC Managed Care – PPO

## 2014-04-23 ENCOUNTER — Other Ambulatory Visit (INDEPENDENT_AMBULATORY_CARE_PROVIDER_SITE_OTHER): Payer: BC Managed Care – PPO

## 2014-04-23 ENCOUNTER — Ambulatory Visit: Payer: BC Managed Care – PPO

## 2014-04-23 DIAGNOSIS — Z113 Encounter for screening for infections with a predominantly sexual mode of transmission: Secondary | ICD-10-CM

## 2014-04-23 DIAGNOSIS — Z23 Encounter for immunization: Secondary | ICD-10-CM

## 2014-04-23 DIAGNOSIS — B20 Human immunodeficiency virus [HIV] disease: Secondary | ICD-10-CM

## 2014-04-23 LAB — COMPREHENSIVE METABOLIC PANEL
ALBUMIN: 4.9 g/dL (ref 3.5–5.2)
ALK PHOS: 46 U/L (ref 39–117)
ALT: 14 U/L (ref 0–53)
AST: 17 U/L (ref 0–37)
BUN: 12 mg/dL (ref 6–23)
CO2: 27 mEq/L (ref 19–32)
Calcium: 10.1 mg/dL (ref 8.4–10.5)
Chloride: 102 mEq/L (ref 96–112)
Creat: 1.03 mg/dL (ref 0.50–1.35)
GLUCOSE: 79 mg/dL (ref 70–99)
POTASSIUM: 4.5 meq/L (ref 3.5–5.3)
SODIUM: 138 meq/L (ref 135–145)
TOTAL PROTEIN: 8 g/dL (ref 6.0–8.3)
Total Bilirubin: 0.6 mg/dL (ref 0.2–1.1)

## 2014-04-23 LAB — CBC WITH DIFFERENTIAL/PLATELET
Basophils Absolute: 0 10*3/uL (ref 0.0–0.1)
Basophils Relative: 0 % (ref 0–1)
EOS PCT: 1 % (ref 0–5)
Eosinophils Absolute: 0.1 10*3/uL (ref 0.0–0.7)
HCT: 45.5 % (ref 39.0–52.0)
Hemoglobin: 15.1 g/dL (ref 13.0–17.0)
LYMPHS ABS: 2.3 10*3/uL (ref 0.7–4.0)
LYMPHS PCT: 25 % (ref 12–46)
MCH: 26.6 pg (ref 26.0–34.0)
MCHC: 33.2 g/dL (ref 30.0–36.0)
MCV: 80.1 fL (ref 78.0–100.0)
MONOS PCT: 7 % (ref 3–12)
Monocytes Absolute: 0.6 10*3/uL (ref 0.1–1.0)
Neutro Abs: 6.2 10*3/uL (ref 1.7–7.7)
Neutrophils Relative %: 67 % (ref 43–77)
Platelets: 246 10*3/uL (ref 150–400)
RBC: 5.68 MIL/uL (ref 4.22–5.81)
RDW: 14.6 % (ref 11.5–15.5)
WBC: 9.2 10*3/uL (ref 4.0–10.5)

## 2014-04-23 LAB — RPR

## 2014-04-24 LAB — URINE CYTOLOGY ANCILLARY ONLY
Chlamydia: NEGATIVE
NEISSERIA GONORRHEA: NEGATIVE

## 2014-04-24 LAB — T-HELPER CELL (CD4) - (RCID CLINIC ONLY)
CD4 % Helper T Cell: 33 % (ref 33–55)
CD4 T Cell Abs: 780 /uL (ref 400–2700)

## 2014-04-26 LAB — HIV-1 RNA QUANT-NO REFLEX-BLD
HIV 1 RNA Quant: <20 copies/mL (ref <20)
HIV-1 RNA Quant, Log: <1.3 {Log} (ref <1.30)

## 2014-05-01 ENCOUNTER — Ambulatory Visit: Payer: BC Managed Care – PPO | Admitting: Internal Medicine

## 2014-05-06 ENCOUNTER — Ambulatory Visit: Payer: BC Managed Care – PPO | Admitting: Internal Medicine

## 2014-05-10 ENCOUNTER — Other Ambulatory Visit: Payer: Self-pay | Admitting: Internal Medicine

## 2014-05-10 DIAGNOSIS — B20 Human immunodeficiency virus [HIV] disease: Secondary | ICD-10-CM

## 2014-05-28 ENCOUNTER — Ambulatory Visit (INDEPENDENT_AMBULATORY_CARE_PROVIDER_SITE_OTHER): Payer: BC Managed Care – PPO | Admitting: Internal Medicine

## 2014-05-28 ENCOUNTER — Encounter: Payer: Self-pay | Admitting: Internal Medicine

## 2014-05-28 VITALS — BP 119/70 | HR 60 | Temp 98.3°F | Ht 71.0 in | Wt 208.0 lb

## 2014-05-28 DIAGNOSIS — A539 Syphilis, unspecified: Secondary | ICD-10-CM | POA: Diagnosis not present

## 2014-05-28 DIAGNOSIS — B20 Human immunodeficiency virus [HIV] disease: Secondary | ICD-10-CM

## 2014-05-28 NOTE — Progress Notes (Signed)
Patient ID: Gary Pena, male   DOB: 12/23/1995, 18 y.o.   MRN: [id number]       Patient ID: Gary Pena, male   DOB: 09/10/1995, 18 y.o.   MRN: [id number]  HPI  18yo M with HIV, CD 4 count of 780/VL<20 on stribild. Doing well with meds. He states he is in good state of health. No recent illness. He has questions to whether he would qualify for [facility] services. Rash improved with ketoconazole  Outpatient Encounter Prescriptions as of 05/28/2014  Medication Sig  . ketoconazole (NIZORAL) 2 % cream Apply 1 application topically daily.  . STRIBILD 150-150-200-300 MG TABS tablet TAKE 1 TABLET BY MOUTH DAILY WITH BREAKFAST  . [DISCONTINUED] butalbital-acetaminophen-caffeine (FIORICET/CODEINE) 50-325-40-30 MG per capsule Take 1 capsule by mouth every 4 (four) hours as needed for headache.  . [DISCONTINUED] SEPTRA DS 800-160 MG per tablet Take 1 tablet by mouth 2 (two) times daily.     Patient Active Problem List   Diagnosis Date Noted  . Human immunodeficiency virus (HIV) disease 05/01/2013  . Syphilis 05/01/2013     Health Maintenance Due  Topic Date Due  . TETANUS/TDAP  04/26/2014     Review of Systems Review of Systems  Constitutional: Negative for fever, chills, diaphoresis, activity change, appetite change, fatigue and unexpected weight change.  HENT: Negative for congestion, sore throat, rhinorrhea, sneezing, trouble swallowing and sinus pressure.  Eyes: Negative for photophobia and visual disturbance.  Respiratory: Negative for cough, chest tightness, shortness of breath, wheezing and stridor.  Cardiovascular: Negative for chest pain, palpitations and leg swelling.  Gastrointestinal: Negative for nausea, vomiting, abdominal pain, diarrhea, constipation, blood in stool, abdominal distention and anal bleeding.  Genitourinary: Negative for dysuria, hematuria, flank pain and difficulty urinating.  Musculoskeletal: Negative for myalgias, back pain, joint swelling, arthralgias and gait  problem.  Skin: Negative for color change, pallor, rash and wound.  Neurological: Negative for dizziness, tremors, weakness and light-headedness.  Hematological: Negative for adenopathy. Does not bruise/bleed easily.  Psychiatric/Behavioral: Negative for behavioral problems, confusion, sleep disturbance, dysphoric mood, decreased concentration and agitation.    Physical Exam   BP 119/70 mmHg  Pulse 60  Temp(Src) 98.3 F (36.8 C) (Oral)  Ht 5\' 11"  (1.803 m)  Wt 208 lb (94.348 kg)  BMI 29.02 kg/m2 Physical Exam  Constitutional: He is oriented to person, place, and time. He appears well-developed and well-nourished. No distress.  HENT:  Mouth/Throat: Oropharynx is clear and moist. No oropharyngeal exudate.  Cardiovascular: Normal rate, regular rhythm and normal heart sounds. Exam reveals no gallop and no friction rub.  No murmur heard.  Pulmonary/Chest: Effort normal and breath sounds normal. No respiratory distress. He has no wheezes.  Abdominal: Soft. Bowel sounds are normal. He exhibits no distension. There is no tenderness.  Lymphadenopathy:  He has no cervical adenopathy.  Neurological: He is alert and oriented to person, place, and time.  Skin: Skin is warm and dry. No rash noted. No erythema.  Psychiatric: He has a normal mood and affect. His behavior is normal.    Lab Results  Component Value Date   CD4TCELL 33 04/23/2014   Lab Results  Component Value Date   CD4TABS 780 04/23/2014   CD4TABS 680 11/28/2013   CD4TABS 700 08/16/2013   Lab Results  Component Value Date   HIV1RNAQUANT <20 04/23/2014   Lab Results  Component Value Date   HEPBSAB POS* 04/19/2013   No results found for: RPR  CBC Lab Results  Component Value Date  WBC 9.2 04/23/2014   RBC 5.68 04/23/2014   HGB 15.1 04/23/2014   HCT 45.5 04/23/2014   PLT 246 04/23/2014   MCV 80.1 04/23/2014   MCH 26.6 04/23/2014   MCHC 33.2 04/23/2014   RDW 14.6 04/23/2014   LYMPHSABS 2.3 04/23/2014    MONOABS 0.6 04/23/2014   EOSABS 0.1 04/23/2014   BASOSABS 0.0 04/23/2014   BMET Lab Results  Component Value Date   NA 138 04/23/2014   K 4.5 04/23/2014   CL 102 04/23/2014   CO2 27 04/23/2014   GLUCOSE 79 04/23/2014   BUN 12 04/23/2014   CREATININE 1.03 04/23/2014   CALCIUM 10.1 04/23/2014   GFRNONAA >89 04/19/2013   GFRAA >89 04/19/2013     Assessment and Plan  hiv = well controlled, continue with stribild  Health maintenance = received flu vaccine uptodate  Hx of syphilis = repeat in 5 months  rtc in 3 months

## 2014-06-06 ENCOUNTER — Other Ambulatory Visit: Payer: Self-pay | Admitting: Internal Medicine

## 2014-06-06 DIAGNOSIS — B20 Human immunodeficiency virus [HIV] disease: Secondary | ICD-10-CM

## 2014-06-12 ENCOUNTER — Encounter (HOSPITAL_COMMUNITY): Payer: Self-pay

## 2014-06-12 ENCOUNTER — Emergency Department (INDEPENDENT_AMBULATORY_CARE_PROVIDER_SITE_OTHER)
Admission: EM | Admit: 2014-06-12 | Discharge: 2014-06-12 | Disposition: A | Discharge status: Home / Self Care | Payer: BC Managed Care – PPO | Source: Home / Self Care | Attending: Family Medicine | Admitting: Family Medicine

## 2014-06-12 DIAGNOSIS — R079 Chest pain, unspecified: Secondary | ICD-10-CM

## 2014-06-12 DIAGNOSIS — R0789 Other chest pain: Secondary | ICD-10-CM
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
## 2014-06-12 NOTE — Discharge Instructions (Signed)
Chest Wall Pain °Chest wall pain is pain in or around the bones and muscles of your chest. It may take up to 6 weeks to get better. It may take longer if you must stay physically active in your work and activities.  °CAUSES  °Chest wall pain may happen on its own. However, it may be caused by: °· A viral illness like the flu. °· Injury. °· Coughing. °· Exercise. °· Arthritis. °· Fibromyalgia. °· Shingles. °HOME CARE INSTRUCTIONS  °· Avoid overtiring physical activity. Try not to strain or perform activities that cause pain. This includes any activities using your chest or your abdominal and side muscles, especially if heavy weights are used. °· Put ice on the sore area. °· Put ice in a plastic bag. °· Place a towel between your skin and the bag. °· Leave the ice on for 15-20 minutes per hour while awake for the first 2 days. °· Only take over-the-counter or prescription medicines for pain, discomfort, or fever as directed by your caregiver. °SEEK IMMEDIATE MEDICAL CARE IF:  °· Your pain increases, or you are very uncomfortable. °· You have a fever. °· Your chest pain becomes worse. °· You have new, unexplained symptoms. °· You have nausea or vomiting. °· You feel sweaty or lightheaded. °· You have a cough with phlegm (sputum), or you cough up blood. °MAKE SURE YOU:  °· Understand these instructions. °· Will watch your condition. °· Will get help right away if you are not doing well or get worse. °Document Released: 07/12/2005 Document Revised: 10/04/2011 Document Reviewed: 03/08/2011 °ExitCare® Patient Information ©2015 [employer]. This information is not intended to replace advice given to you by your health care provider. Make sure you discuss any questions you have with your health care provider. ° °Motor Vehicle Collision °It is common to have multiple bruises and sore muscles after a motor vehicle collision (MVC). These tend to feel worse for the first 24 hours. You may have the most stiffness and soreness  over the first several hours. You may also feel worse when you wake up the first morning after your collision. After this point, you will usually begin to improve with each day. The speed of improvement often depends on the severity of the collision, the number of injuries, and the location and nature of these injuries. °HOME CARE INSTRUCTIONS °· Put ice on the injured area. °¨ Put ice in a plastic bag. °¨ Place a towel between your skin and the bag. °¨ Leave the ice on for 15-20 minutes, 3-4 times a day, or as directed by your health care provider. °· Drink enough fluids to keep your urine clear or pale yellow. Do not drink alcohol. °· Take a warm shower or bath once or twice a day. This will increase blood flow to sore muscles. °· You may return to activities as directed by your caregiver. Be careful when lifting, as this may aggravate neck or back pain. °· Only take over-the-counter or prescription medicines for pain, discomfort, or fever as directed by your caregiver. Do not use aspirin. This may increase bruising and bleeding. °SEEK IMMEDIATE MEDICAL CARE IF: °· You have numbness, tingling, or weakness in the arms or legs. °· You develop severe headaches not relieved with medicine. °· You have severe neck pain, especially tenderness in the middle of the back of your neck. °· You have changes in bowel or bladder control. °· There is increasing pain in any area of the body. °· You have shortness of   breath, light-headedness, dizziness, or fainting. °· You have chest pain. °· You feel sick to your stomach (nauseous), throw up (vomit), or sweat. °· You have increasing abdominal discomfort. °· There is blood in your urine, stool, or vomit. °· You have pain in your shoulder (shoulder strap areas). °· You feel your symptoms are getting worse. °MAKE SURE YOU: °· Understand these instructions. °· Will watch your condition. °· Will get help right away if you are not doing well or get worse. °Document Released: 07/12/2005  Document Revised: 11/26/2013 Document Reviewed: 12/09/2010 °ExitCare® Patient Information ©2015 [employer]. This information is not intended to replace advice given to you by your health care provider. Make sure you discuss any questions you have with your health care provider. ° °

## 2014-07-23 ENCOUNTER — Encounter (HOSPITAL_COMMUNITY): Payer: Self-pay | Admitting: Emergency Medicine

## 2014-07-23 ENCOUNTER — Other Ambulatory Visit (HOSPITAL_COMMUNITY)
Admission: RE | Admit: 2014-07-23 | Discharge: 2014-07-23 | Disposition: A | Discharge status: Home / Self Care | Payer: BC Managed Care – PPO | Source: Ambulatory Visit | Attending: Family Medicine | Admitting: Family Medicine

## 2014-07-23 ENCOUNTER — Emergency Department (INDEPENDENT_AMBULATORY_CARE_PROVIDER_SITE_OTHER)
Admission: EM | Admit: 2014-07-23 | Discharge: 2014-07-23 | Disposition: A | Discharge status: Home / Self Care | Payer: BC Managed Care – PPO | Source: Home / Self Care | Attending: Family Medicine | Admitting: Family Medicine

## 2014-07-23 DIAGNOSIS — R300 Dysuria: Secondary | ICD-10-CM

## 2014-07-23 DIAGNOSIS — B20 Human immunodeficiency virus [HIV] disease: Secondary | ICD-10-CM

## 2014-07-23 DIAGNOSIS — Z21 Asymptomatic human immunodeficiency virus [HIV] infection status: Secondary | ICD-10-CM

## 2014-07-23 DIAGNOSIS — R369 Urethral discharge, unspecified: Secondary | ICD-10-CM

## 2014-07-23 DIAGNOSIS — Z113 Encounter for screening for infections with a predominantly sexual mode of transmission: Secondary | ICD-10-CM | POA: Diagnosis present

## 2014-07-23 LAB — POCT URINALYSIS DIP (DEVICE)
Bilirubin Urine: NEGATIVE
Glucose, UA: NEGATIVE mg/dL
Ketones, ur: NEGATIVE mg/dL
NITRITE: NEGATIVE
PH: 7 (ref 5.0–8.0)
PROTEIN: NEGATIVE mg/dL
Specific Gravity, Urine: 1.02 (ref 1.005–1.030)
UROBILINOGEN UA: 1 mg/dL (ref 0.0–1.0)

## 2014-07-23 MED ORDER — LIDOCAINE HCL (PF) 1 % IJ SOLN
INTRAMUSCULAR | Status: AC
  Filled 2014-07-23: qty 5

## 2014-07-23 MED ORDER — AZITHROMYCIN 250 MG PO TABS
ORAL_TABLET | ORAL | Status: DC
Start: 2014-07-23 — End: 2014-11-27

## 2014-07-23 MED ORDER — CEFTRIAXONE SODIUM 250 MG IJ SOLR
250.0000 mg | Freq: Once | INTRAMUSCULAR | Status: AC
  Administered 2014-07-23: 250 mg via INTRAMUSCULAR

## 2014-07-23 MED ORDER — CEFTRIAXONE SODIUM 250 MG IJ SOLR
INTRAMUSCULAR | Status: AC
  Filled 2014-07-23: qty 250

## 2014-07-23 NOTE — Discharge Instructions (Signed)

## 2014-07-23 NOTE — ED Provider Notes (Signed)
CSN: [id number]     Arrival date & time 07/23/14  0804 History   First MD Initiated Contact with Patient 07/23/14 0813     Chief Complaint  Patient presents with  . Penile Discharge   (Consider location/radiation/quality/duration/timing/severity/associated sxs/prior Treatment) HPI Comments: 18 y o M with HIV developed a penile discharge and dysuria yesterday. Sexually active with men.   Past Medical History  Diagnosis Date  . Immune deficiency disorder   . HIV (human immunodeficiency virus infection)   . Skin abscess     01-25-14  recheck on 01-28-14   History reviewed. No pertinent past surgical history. No family history on file. History  Substance Use Topics  . Smoking status: Current Some Day Smoker    Types: Cigarettes  . Smokeless tobacco: Never Used     Comment: smoked due to stress   . Alcohol Use: No    Review of Systems  Constitutional: Negative.   Genitourinary: Positive for dysuria and discharge. Negative for urgency, frequency, hematuria, flank pain, scrotal swelling, genital sores and testicular pain.    Allergies  Review of patient's allergies indicates no known allergies.  Home Medications   Prior to Admission medications   Medication Sig Start Date End Date Taking? Authorizing Provider  azithromycin (ZITHROMAX) 250 MG tablet Take all 4 tabs po now. 07/23/14   Hayden Rasmussenavid Adjoa Althouse, NP  ketoconazole (NIZORAL) 2 % cream Apply 1 application topically daily.    Historical Provider, MD  STRIBILD 150-150-200-300 MG TABS tablet TAKE 1 TABLET BY MOUTH EVERY DAY WITH BREAKFAST 06/07/14   [name], MD   BP 126/76 mmHg  Pulse 52  Temp(Src) 98.3 F (36.8 C) (Oral)  Resp 16  SpO2 99% Physical Exam  Constitutional: He is oriented to person, place, and time. He appears well-developed and well-nourished. No distress.  Neck: Normal range of motion. Neck supple.  Pulmonary/Chest: Effort normal. No respiratory distress.  Neurological: He is alert and oriented to person,  place, and time.  Skin: Skin is warm and dry.  Psychiatric: He has a normal mood and affect.  Nursing note and vitals reviewed.   ED Course  Procedures (including critical care time) Labs Review Labs Reviewed  URINE CYTOLOGY ANCILLARY ONLY   Imaging Review No results found.   MDM   1. Abnormal penile discharge   2. Dysuria   3. HIV (human immunodeficiency virus infection)    Likely STD/GC or /+ chlamydia Urine cytology pending Rocephin 250 mg IM Rx for azithromycin 1 gm po    Hayden Rasmussenavid Inita Uram, NP 07/23/14 [phone]

## 2014-07-23 NOTE — ED Notes (Signed)
Patient c/o penile discharge with dysuria onset yesterday. Patient denies fever or n/v/d. Patient is in NAD.

## 2014-07-24 LAB — URINE CYTOLOGY ANCILLARY ONLY
CHLAMYDIA, DNA PROBE: NEGATIVE
Neisseria Gonorrhea: POSITIVE — AB
Trichomonas: NEGATIVE

## 2014-07-25 ENCOUNTER — Telehealth (HOSPITAL_COMMUNITY): Payer: Self-pay | Admitting: *Deleted

## 2014-07-25 LAB — URINE CYTOLOGY ANCILLARY ONLY: BACTERIAL VAGINITIS: NEGATIVE

## 2014-07-25 NOTE — ED Notes (Signed)
GC pos., Chlamydia and Trich neg. I called pt. Pt. verified x 2 and given results.  Pt. told he was adequately treated with Rocephin and Zithromax. Pt. instructed to notify his partner, no sex for 1 week and to practice safe sex. Pt[facility], by appointment.  Pt. voiced understanding.  [facility]. Vassie MoselleYork, Geri Hepler M 07/25/2014

## 2014-08-29 ENCOUNTER — Ambulatory Visit: Payer: BC Managed Care – PPO | Admitting: Internal Medicine

## 2014-10-02 ENCOUNTER — Encounter (HOSPITAL_COMMUNITY): Payer: Self-pay | Admitting: *Deleted

## 2014-10-02 ENCOUNTER — Emergency Department (HOSPITAL_COMMUNITY)
Admission: EM | Admit: 2014-10-02 | Discharge: 2014-10-03 | Disposition: A | Discharge status: Home / Self Care | Payer: BC Managed Care – PPO | Attending: Emergency Medicine | Admitting: Emergency Medicine

## 2014-10-02 DIAGNOSIS — Z872 Personal history of diseases of the skin and subcutaneous tissue: Secondary | ICD-10-CM | POA: Diagnosis not present

## 2014-10-02 DIAGNOSIS — Z21 Asymptomatic human immunodeficiency virus [HIV] infection status: Secondary | ICD-10-CM | POA: Diagnosis not present

## 2014-10-02 DIAGNOSIS — Z79899 Other long term (current) drug therapy: Secondary | ICD-10-CM | POA: Insufficient documentation

## 2014-10-02 DIAGNOSIS — Z862 Personal history of diseases of the blood and blood-forming organs and certain disorders involving the immune mechanism: Secondary | ICD-10-CM | POA: Insufficient documentation

## 2014-10-02 DIAGNOSIS — Z720 Tobacco use: Secondary | ICD-10-CM | POA: Insufficient documentation

## 2014-10-02 DIAGNOSIS — R369 Urethral discharge, unspecified: Secondary | ICD-10-CM | POA: Diagnosis present

## 2014-10-02 NOTE — ED Notes (Signed)
Pt c/o penile discharge since Sunday.

## 2014-10-02 NOTE — ED Provider Notes (Signed)
CSN: [id number]     Arrival date & time 10/02/14  2319 History   First MD Initiated Contact with Patient 10/02/14 2329     No chief complaint on file.    (Consider location/radiation/quality/duration/timing/severity/associated sxs/prior Treatment) HPI Comments: Patient with a history of HIV presents today with a chief complaint of penile discharge.  He reports that he has had the discharge for the past 4 days.  He describes the discharge as being yellowish and also whitish in color.  He states that he has had similar symptoms in the past when he was diagnosed with Gonorrhea.  He denies fever, chills, nausea, vomiting, abdominal pain, scrotal pain, scrotal swelling, or penile lesions.  Patient is sexually active.  He reports that he has been compliant with his HIV medications.  The history is provided by the patient.    Past Medical History  Diagnosis Date  . Immune deficiency disorder   . HIV (human immunodeficiency virus infection)   . Skin abscess     01-25-14  recheck on 01-28-14   History reviewed. No pertinent past surgical history. No family history on file. History  Substance Use Topics  . Smoking status: Current Some Day Smoker    Types: Cigarettes  . Smokeless tobacco: Never Used     Comment: smoked due to stress   . Alcohol Use: No    Review of Systems  Gastrointestinal: Negative for nausea, vomiting and abdominal pain.  Genitourinary: Positive for discharge. Negative for dysuria, frequency, penile swelling, scrotal swelling, genital sores, penile pain and testicular pain.      Allergies  Review of patient's allergies indicates no known allergies.  Home Medications   Prior to Admission medications   Medication Sig Start Date End Date Taking? Authorizing Provider  azithromycin (ZITHROMAX) 250 MG tablet Take all 4 tabs po now. 07/23/14   Hayden Rasmussen, NP  ketoconazole (NIZORAL) 2 % cream Apply 1 application topically daily.    Historical Provider, MD  STRIBILD  150-150-200-300 MG TABS tablet TAKE 1 TABLET BY MOUTH EVERY DAY WITH BREAKFAST 06/07/14   Judyann Munson, MD   BP 135/73 mmHg  Pulse 80  Temp(Src) 97.9 F (36.6 C) (Oral)  Resp 18  Ht  (1.803 m)  Wt 190 lb (86.183 kg)  BMI 26.51 kg/m2  SpO2 95% Physical Exam  Constitutional: He appears well-developed and well-nourished.  HENT:  Head: Normocephalic and atraumatic.  Mouth/Throat: Oropharynx is clear and moist.  Cardiovascular: Normal rate, regular rhythm and normal heart sounds.   Pulmonary/Chest: Effort normal and breath sounds normal.  Genitourinary: Testes normal. Right testis shows no mass, no swelling and no tenderness. Left testis shows no mass, no swelling and no tenderness. Circumcised. No penile erythema. Discharge found.  Yellowish/greenish colored penile discharge  Neurological: He is alert.  Skin: Skin is warm and dry.  Psychiatric: He has a normal mood and affect.  Nursing note and vitals reviewed.   ED Course  Procedures (including critical care time) Labs Review Labs Reviewed  GC/CHLAMYDIA PROBE AMP ([place])    Imaging Review No results found.   EKG Interpretation None      MDM   Final diagnoses:  None   Patient with a history of HIV presenting today with complaints of penile discharge.  On exam, patient with yellowish/greenish colored discharge.  GC/Chlamydia and RPR pending.  Patient given prophylactic treatment with Rocephin and Azithromycin.  Patient stable for discharge.  Return precautions given.      Santiago Glad, PA-C 10/03/14  [phone]  Raeford RazorStephen Kohut, MD 10/07/14 [phone]

## 2014-10-03 DIAGNOSIS — R369 Urethral discharge, unspecified: Secondary | ICD-10-CM | POA: Diagnosis not present

## 2014-10-03 LAB — GC/CHLAMYDIA PROBE AMP (~~LOC~~) NOT AT ARMC
Chlamydia: NEGATIVE
Neisseria Gonorrhea: POSITIVE — AB

## 2014-10-03 LAB — RPR: RPR Ser Ql: NONREACTIVE

## 2014-10-03 MED ORDER — LIDOCAINE HCL (PF) 1 % IJ SOLN
1.2000 mL | Freq: Once | INTRAMUSCULAR | Status: AC
  Administered 2014-10-03: 1.2 mL via INTRADERMAL
  Filled 2014-10-03: qty 5

## 2014-10-03 MED ORDER — CEFTRIAXONE SODIUM 250 MG IJ SOLR
250.0000 mg | Freq: Once | INTRAMUSCULAR | Status: AC
  Administered 2014-10-03: 250 mg via INTRAMUSCULAR
  Filled 2014-10-03: qty 250

## 2014-10-03 MED ORDER — AZITHROMYCIN 250 MG PO TABS
1000.0000 mg | ORAL_TABLET | Freq: Once | ORAL | Status: AC
  Administered 2014-10-03: 1000 mg via ORAL
  Filled 2014-10-03: qty 4

## 2014-10-03 NOTE — Discharge Instructions (Signed)
You have been treated in the emergency department for an infection, possibly sexually transmitted. Results of your gonorrhea and chlamydia tests are pending and you will be notified if they are positive. Syphilis testing is also pending.  It is very important to practice safe sex and use condoms when sexually active. If your results are positive you need to notify all sexual partners so they can be treated as well. The website [email or web address] can be used to send anonymous text messages or emails to alert sexual contacts. Follow up with your doctor regards to today's visit.    Gonorrhea and Chlamydia SYMPTOMS  In females, symptoms may go unnoticed. Symptoms that are more noticeable can include:  Belly (abdominal) pain.  Painful intercourse.  Watery mucous-like discharge from the vagina.  Miscarriage.  Discomfort when urinating.  Inflammation of the rectum.  Abnormal gray-green frothy vaginal discharge  Vaginal itching and irritatio  Itching and irritation of the area outside the vagina.   Painful urination.  Bleeding after sexual intercourse.  In males, symptoms include:  Burning with urination.  Pain in the testicles.  Watery mucous-like discharge from the penis.  It can cause longstanding (chronic) pelvic pain after frequent infections.  TREATMENT  PID can cause women to not be able to have children (sterile) if left untreated or if half-treated.  It is important to finish ALL medications given to you.  This is a sexually transmitted infection. So you are also at risk for other sexually transmitted diseases, including HIV (AIDS), it is recommended that you get tested. HOME CARE INSTRUCTIONS  Warning: This infection is contagious. Do not have sex until treatment is completed. Follow up at your caregiver's office or the clinic to which you were referred. If your diagnosis (learning what is wrong) is confirmed by culture or some other method, your recent sexual contacts need  treatment. Even if they are symptom free or have a negative culture or evaluation, they should be treated.  PREVENTION  Women should use sanitary pads instead of tampons for vaginal discharge.  Wipe front to back after using the toilet and avoid douching.   Practice safe sex, use condoms, have only one sex partner and be sure your sex partner is not having sex with others.  Ask your caregiver to test you for chlamydia at your regular checkups or sooner if you are having symptoms.  Ask for further information if you are pregnant.  SEEK IMMEDIATE MEDICAL CARE IF:  You develop an oral temperature above 102 F (38.9 C), not controlled by medications or lasting more than 2 days.  You develop an increase in pain.  You develop any type of abnormal discharge.  You develop vaginal bleeding and it is not time for your period.  You develop painful intercourse.     RESOURCE GUIDE  Dental Problems  Patients with Medicaid: [facility] [phone] [address]                                           [phone] [address] Phone:  [phone]  Phone:  [phone]  If unable to pay or uninsured, contact:  [facility] or [facility] to become qualified for the adult dental clinic.  Chronic Pain Problems Contact [facility]  [phone] Patients need to be referred by their primary care doctor.  Insufficient Money for Medicine Contact United Way:  call "211" or [facility] [phone].  No Primary Care Doctor Call [facility]  [phone] Other agencies that provide inexpensive medical care    [facility]  [phone]    [facility]  [phone]    [facility]  [phone]    [facility]  [phone]    [facility]  [phone]    [facility]  [phone]  Psychological Services [facility]   [phone] [facility]  [phone] [facility]   [phone] (emergency services [phone])  Substance Abuse Resources [facility]  [phone] [facility] [phone] [facility] [phone] [facility] ([phone]) [phone] Residential & Outpatient Substance Abuse Program  [phone]  Abuse/Neglect [facility] Hotline ([phone](336) [phone] [facility] Hot[facility] 4043643506([phone] (After Hours)  Emergency [facility]ter [facility] [phone]  [facility] [phone] [facility] [phone]) [phone]  MRSA Hotline #:   (704)285-7093(901)767-1401    [facility]  [facility]     [employer]                          [facility] [address] [place]                       [address]      [address]  [place]                                                [place]                            [place] Phone:  [phone]                                   Phone:  [phone]                 Phone:  [phone]  [facility] Phone:  [phone]  [facility] Hotline [phone](336) [phone]) [phone] (After Hours)

## 2014-10-04 ENCOUNTER — Telehealth (HOSPITAL_BASED_OUTPATIENT_CLINIC_OR_DEPARTMENT_OTHER): Payer: Self-pay | Admitting: Emergency Medicine

## 2014-10-04 NOTE — Telephone Encounter (Signed)
Positive Gonorrhea culture Treated with Rocephin and Zithromax [facility] faxed  10/04/14 @ 1209 patient contacted: ID verified x three. Patient notified of positive Gonorrhea and that treatment was given while in ED with Rocephin and Zithromax. STD instructions provided, patient verbalized understanding.

## 2014-11-27 ENCOUNTER — Encounter: Payer: Self-pay | Admitting: Internal Medicine

## 2014-11-27 ENCOUNTER — Ambulatory Visit (INDEPENDENT_AMBULATORY_CARE_PROVIDER_SITE_OTHER): Payer: BC Managed Care – PPO | Admitting: Internal Medicine

## 2014-11-27 VITALS — BP 114/71 | HR 54 | Temp 98.1°F | Ht 71.0 in | Wt 214.0 lb

## 2014-11-27 DIAGNOSIS — B20 Human immunodeficiency virus [HIV] disease: Secondary | ICD-10-CM

## 2014-11-27 DIAGNOSIS — Z8619 Personal history of other infectious and parasitic diseases: Secondary | ICD-10-CM

## 2014-11-27 LAB — CBC WITH DIFFERENTIAL/PLATELET
BASOS ABS: 0 10*3/uL (ref 0.0–0.1)
Basophils Relative: 0 % (ref 0–1)
Eosinophils Absolute: 0.2 10*3/uL (ref 0.0–0.7)
Eosinophils Relative: 3 % (ref 0–5)
HEMATOCRIT: 44.6 % (ref 39.0–52.0)
HEMOGLOBIN: 14.5 g/dL (ref 13.0–17.0)
LYMPHS PCT: 42 % (ref 12–46)
Lymphs Abs: 2.9 10*3/uL (ref 0.7–4.0)
MCH: 26.6 pg (ref 26.0–34.0)
MCHC: 32.5 g/dL (ref 30.0–36.0)
MCV: 81.7 fL (ref 78.0–100.0)
MPV: 10.6 fL (ref 8.6–12.4)
Monocytes Absolute: 0.6 10*3/uL (ref 0.1–1.0)
Monocytes Relative: 9 % (ref 3–12)
NEUTROS ABS: 3.2 10*3/uL (ref 1.7–7.7)
Neutrophils Relative %: 46 % (ref 43–77)
Platelets: 216 10*3/uL (ref 150–400)
RBC: 5.46 MIL/uL (ref 4.22–5.81)
RDW: 13.9 % (ref 11.5–15.5)
WBC: 7 10*3/uL (ref 4.0–10.5)

## 2014-11-27 LAB — COMPLETE METABOLIC PANEL WITH GFR
ALBUMIN: 4.7 g/dL (ref 3.5–5.2)
ALT: 17 U/L (ref 0–53)
AST: 20 U/L (ref 0–37)
Alkaline Phosphatase: 47 U/L (ref 39–117)
BUN: 14 mg/dL (ref 6–23)
CHLORIDE: 103 meq/L (ref 96–112)
CO2: 27 mEq/L (ref 19–32)
CREATININE: 1.1 mg/dL (ref 0.50–1.35)
Calcium: 9.8 mg/dL (ref 8.4–10.5)
GFR, Est African American: >89 mL/min
GFR, Est Non African American: >89 mL/min
Glucose, Bld: 74 mg/dL (ref 70–99)
POTASSIUM: 4.1 meq/L (ref 3.5–5.3)
SODIUM: 139 meq/L (ref 135–145)
TOTAL PROTEIN: 7.7 g/dL (ref 6.0–8.3)
Total Bilirubin: 0.5 mg/dL (ref 0.2–1.1)

## 2014-11-27 NOTE — Progress Notes (Signed)
Patient ID: Gary Pena, male   DOB: 10/26/1995, 19 y.o.   MRN: [id number]       Patient ID: Gary Pena, male   DOB: 07/29/1995, 19 y.o.   MRN: [id number]  HPI  19yo M with hiv disease, CD 4 count of 780/VL<20 ( sep 2015) on stribild. Also hx of syphilis and gonorrhea. Recently treated for gc/chlam when he went ot the ED for penile discharge, txd empirically with ctx and azithromycin in mid march. He states that he has been using condoms faithfully since then. No recent discharge or rash   Outpatient Encounter Prescriptions as of 11/27/2014  Medication Sig  . ketoconazole (NIZORAL) 2 % cream Apply 1 application topically daily.  . STRIBILD 150-150-200-300 MG TABS tablet TAKE 1 TABLET BY MOUTH EVERY DAY WITH BREAKFAST  . [DISCONTINUED] azithromycin (ZITHROMAX) 250 MG tablet Take all 4 tabs po now.   No facility-administered encounter medications on file as of 11/27/2014.     Patient Active Problem List   Diagnosis Date Noted  . Human immunodeficiency virus (HIV) disease 05/01/2013  . Syphilis 05/01/2013     Health Maintenance Due  Topic Date Due  . TETANUS/TDAP  04/26/2014     Review of Systems 10 point ROS is negative Physical Exam   BP 114/71 mmHg  Pulse 54  Temp(Src) 98.1 F (36.7 C) (Oral)  Ht 5\' 11"  (1.803 m)  Wt 214 lb (97.07 kg)  BMI 29.86 kg/m2 Physical Exam  Constitutional: He is oriented to person, place, and time. He appears well-developed and well-nourished. No distress.  HENT:  Mouth/Throat: Oropharynx is clear and moist. No oropharyngeal exudate.  Cardiovascular: Normal rate, regular rhythm and normal heart sounds. Exam reveals no gallop and no friction rub.  No murmur heard.  Pulmonary/Chest: Effort normal and breath sounds normal. No respiratory distress. He has no wheezes.  Lymphadenopathy:  He has no cervical adenopathy.  Neurological: He is alert and oriented to person, place, and time.  Skin: Skin is warm and dry. No rash noted. No erythema.    Psychiatric: He has a normal mood and affect. His behavior is normal.    Lab Results  Component Value Date   CD4TCELL 33 04/23/2014   Lab Results  Component Value Date   CD4TABS 780 04/23/2014   CD4TABS 680 11/28/2013   CD4TABS 700 08/16/2013   Lab Results  Component Value Date   HIV1RNAQUANT <20 04/23/2014   Lab Results  Component Value Date   HEPBSAB POS* 04/19/2013   No results found for: RPR  CBC Lab Results  Component Value Date   WBC 9.2 04/23/2014   RBC 5.68 04/23/2014   HGB 15.1 04/23/2014   HCT 45.5 04/23/2014   PLT 246 04/23/2014   MCV 80.1 04/23/2014   MCH 26.6 04/23/2014   MCHC 33.2 04/23/2014   RDW 14.6 04/23/2014   LYMPHSABS 2.3 04/23/2014   MONOABS 0.6 04/23/2014   EOSABS 0.1 04/23/2014   BASOSABS 0.0 04/23/2014   BMET Lab Results  Component Value Date   NA 138 04/23/2014   K 4.5 04/23/2014   CL 102 04/23/2014   CO2 27 04/23/2014   GLUCOSE 79 04/23/2014   BUN 12 04/23/2014   CREATININE 1.03 04/23/2014   CALCIUM 10.1 04/23/2014   GFRNONAA >89 04/19/2013   GFRAA >89 04/19/2013     Assessment and Plan  hiv disease = will repeat labs today. Give co pay card  Hx of gc = will check again, no present symptoms, and also check for rpr  High risk sex = recommend to be more consistent with condom use

## 2014-11-28 LAB — HIV-1 RNA QUANT-NO REFLEX-BLD

## 2014-11-29 LAB — URINE CYTOLOGY ANCILLARY ONLY
CHLAMYDIA, DNA PROBE: NEGATIVE
Neisseria Gonorrhea: NEGATIVE

## 2014-11-29 LAB — T-HELPER CELL (CD4) - (RCID CLINIC ONLY)
CD4 % Helper T Cell: 35 % (ref 33–55)
CD4 T CELL ABS: 1020 /uL (ref 400–2700)

## 2014-12-03 ENCOUNTER — Encounter (HOSPITAL_COMMUNITY): Payer: Self-pay | Admitting: Physical Medicine and Rehabilitation

## 2014-12-03 ENCOUNTER — Emergency Department (HOSPITAL_COMMUNITY)
Admission: EM | Admit: 2014-12-03 | Discharge: 2014-12-03 | Disposition: A | Discharge status: Home / Self Care | Payer: BC Managed Care – PPO | Attending: Emergency Medicine | Admitting: Emergency Medicine

## 2014-12-03 DIAGNOSIS — Y9389 Activity, other specified: Secondary | ICD-10-CM | POA: Insufficient documentation

## 2014-12-03 DIAGNOSIS — S61219A Laceration without foreign body of unspecified finger without damage to nail, initial encounter: Secondary | ICD-10-CM

## 2014-12-03 DIAGNOSIS — W260XXA Contact with knife, initial encounter: Secondary | ICD-10-CM | POA: Insufficient documentation

## 2014-12-03 DIAGNOSIS — S61212A Laceration without foreign body of right middle finger without damage to nail, initial encounter: Secondary | ICD-10-CM | POA: Diagnosis not present

## 2014-12-03 DIAGNOSIS — Z720 Tobacco use: Secondary | ICD-10-CM | POA: Insufficient documentation

## 2014-12-03 DIAGNOSIS — Z21 Asymptomatic human immunodeficiency virus [HIV] infection status: Secondary | ICD-10-CM | POA: Insufficient documentation

## 2014-12-03 DIAGNOSIS — Y929 Unspecified place or not applicable: Secondary | ICD-10-CM | POA: Insufficient documentation

## 2014-12-03 DIAGNOSIS — Z872 Personal history of diseases of the skin and subcutaneous tissue: Secondary | ICD-10-CM | POA: Diagnosis not present

## 2014-12-03 DIAGNOSIS — Z862 Personal history of diseases of the blood and blood-forming organs and certain disorders involving the immune mechanism: Secondary | ICD-10-CM | POA: Insufficient documentation

## 2014-12-03 DIAGNOSIS — Y998 Other external cause status: Secondary | ICD-10-CM | POA: Insufficient documentation

## 2014-12-03 MED ORDER — LIDOCAINE HCL (PF) 1 % IJ SOLN
5.0000 mL | Freq: Once | INTRAMUSCULAR | Status: AC
  Administered 2014-12-03: 5 mL via INTRADERMAL
  Filled 2014-12-03: qty 5

## 2014-12-03 NOTE — ED Provider Notes (Signed)
CSN: [id number]     Arrival date & time 12/03/14  1111 History  This chart was scribed for Emilia BeckKaitlyn Vincenza Dail, PA-C working with Bethann BerkshireJoseph Zammit, MD by Evon Slackerrance Branch, ED Scribe. This patient was seen in room TR07C/TR07C and the patient's care was started at 12:02 PM.     Chief Complaint  Patient presents with  . Laceration   The history is provided by the patient. No language interpreter was used.   HPI Comments: Gary Pena is a 19 y.o. male who presents to the Emergency Department complaining of new laceration to the right middle finger onset today PTA. Pt states that he was cutting bacon this morning and the knife slipped cutting his finger. Pt states that the bleeding is controlled. Pt denies numbness or tingling. Pain is mild and aching without radiation. Patient applied pressure to control bleeding.   Past Medical History  Diagnosis Date  . Immune deficiency disorder   . HIV (human immunodeficiency virus infection)   . Skin abscess     01-25-14  recheck on 01-28-14   History reviewed. No pertinent past surgical history. History reviewed. No pertinent family history. History  Substance Use Topics  . Smoking status: Current Some Day Smoker    Types: Cigarettes  . Smokeless tobacco: Never Used     Comment: smoked due to stress   . Alcohol Use: No    Review of Systems  Skin: Positive for wound.  Neurological: Negative for numbness.  All other systems reviewed and are negative.    Allergies  Review of patient's allergies indicates no known allergies.  Home Medications   Prior to Admission medications   Medication Sig Start Date End Date Taking? Authorizing Provider  ketoconazole (NIZORAL) 2 % cream Apply 1 application topically daily as needed for irritation.    Yes Historical Provider, MD  STRIBILD 150-150-200-300 MG TABS tablet TAKE 1 TABLET BY MOUTH EVERY DAY WITH BREAKFAST 06/07/14  Yes [name], MD   BP 110/66 mmHg  Pulse 62  Temp(Src) 97.7 F (36.5 C)  (Oral)  Resp 18  Ht 5\' 11"  (1.803 m)  Wt 200 lb (90.719 kg)  BMI 27.91 kg/m2  SpO2 99%   Physical Exam  Constitutional: He is oriented to person, place, and time. He appears well-developed and well-nourished. No distress.  HENT:  Head: Normocephalic and atraumatic.  Eyes: Conjunctivae and EOM are normal.  Neck: Neck supple. No tracheal deviation present.  Cardiovascular: Normal rate.   Pulmonary/Chest: Effort normal. No respiratory distress.  Musculoskeletal: Normal range of motion.  Full ROM of right middle finger, distal sensation intact or right middle finger.   Neurological: He is alert and oriented to person, place, and time.  Skin: Skin is warm and dry.  2cm laceration of volar aspect of right proximal middle finger.   Psychiatric: He has a normal mood and affect. His behavior is normal.  Nursing note and vitals reviewed.   ED Course  Procedures (including critical care time) DIAGNOSTIC STUDIES: Oxygen Saturation is 99% on RA, normal by my interpretation.    COORDINATION OF CARE: 12:20 PM-Discussed treatment plan with pt at bedside and pt agreed to plan.   LACERATION REPAIR Performed by: Emilia BeckKaitlyn Annabel Gibeau Authorized by: Emilia BeckKaitlyn Gianny Killman Consent: Verbal consent obtained. Risks and benefits: risks, benefits and alternatives were discussed Consent given by: patient Patient identity confirmed: provided demographic data Prepped and Draped in normal sterile fashion Wound explored  Laceration Location: right middle finger  Laceration Length: 3 cm  No Foreign Bodies seen  or palpated  Anesthesia: local infiltration  Local anesthetic: lidocaine 1% without epinephrine  Anesthetic total: 2 ml  Irrigation method: syringe Amount of cleaning: standard  Skin closure: 4-0 ethilon  Number of sutures: 7  Technique: simple  Patient tolerance: Patient tolerated the procedure well with no immediate complications.   Labs Review Labs Reviewed - No data to  display  Imaging Review No results found.   EKG Interpretation None      MDM   Final diagnoses:  Finger laceration, initial encounter   12:54 PM Laceration repaired without complication. Patient instructed to return in 10 days for suture removal. Vitals stable and patient afebrile.    I personally performed the services described in this documentation, which was scribed in my presence. The recorded information has been reviewed and is accurate.      Emilia BeckKaitlyn Linnell Swords, PA-C 12/04/14 1056  Bethann BerkshireJoseph Zammit, MD 12/04/14 1450

## 2014-12-03 NOTE — ED Notes (Signed)
Pt reports he accidentally cut R middle finger with knife this morning while cooking breakfast. Laceration noted, bleeding controlled upon arrival to ED. Able to wiggle digit, sensation intact.

## 2014-12-03 NOTE — ED Notes (Signed)
Laceration to right middle finger with kitchen knife this am. Bleeding controlled.

## 2014-12-03 NOTE — Discharge Instructions (Signed)
Keep wound area clean. Return to the ED in 10 days for suture removal. Refer to attached documents for more information.  °

## 2014-12-17 ENCOUNTER — Emergency Department (HOSPITAL_COMMUNITY)
Admission: EM | Admit: 2014-12-17 | Discharge: 2014-12-17 | Disposition: A | Discharge status: Home / Self Care | Payer: BC Managed Care – PPO | Attending: Emergency Medicine | Admitting: Emergency Medicine

## 2014-12-17 ENCOUNTER — Encounter (HOSPITAL_COMMUNITY): Payer: Self-pay | Admitting: Emergency Medicine

## 2014-12-17 DIAGNOSIS — Z4802 Encounter for removal of sutures: Secondary | ICD-10-CM | POA: Diagnosis present

## 2014-12-17 DIAGNOSIS — Z862 Personal history of diseases of the blood and blood-forming organs and certain disorders involving the immune mechanism: Secondary | ICD-10-CM | POA: Insufficient documentation

## 2014-12-17 DIAGNOSIS — Z720 Tobacco use: Secondary | ICD-10-CM | POA: Insufficient documentation

## 2014-12-17 DIAGNOSIS — B20 Human immunodeficiency virus [HIV] disease: Secondary | ICD-10-CM | POA: Diagnosis not present

## 2014-12-17 DIAGNOSIS — Z872 Personal history of diseases of the skin and subcutaneous tissue: Secondary | ICD-10-CM | POA: Diagnosis not present

## 2014-12-17 NOTE — ED Provider Notes (Signed)
CSN: [id number]     Arrival date & time 12/17/14  2220 History  This chart was scribed for Arthor CaptainAbigail Winnona Wargo, working with Eber HongBrian Miller, MD by Placido SouLogan Joldersma, ED Scribe. This patient was seen in room TR08C/TR08C and the patient's care was started at 10:50 PM.      Chief Complaint  Patient presents with  . Suture / Staple Removal    HPI  HPI Comments: Gary Pena is a 19 y.o. male who presents to the Emergency Department complaining of suture removal. Pt notes the staples were applied 2 weeks ago in the ED and was instructed to have them removed in 10 days. Pt notes slight numbness to the wound.  Pt denies discharge from wound, pain or any signs of infection.    Past Medical History  Diagnosis Date  . Immune deficiency disorder   . HIV (human immunodeficiency virus infection)   . Skin abscess     01-25-14  recheck on 01-28-14   History reviewed. No pertinent past surgical history. No family history on file. History  Substance Use Topics  . Smoking status: Current Some Day Smoker    Types: Cigarettes  . Smokeless tobacco: Never Used     Comment: smoked due to stress   . Alcohol Use: No    Review of Systems  Constitutional: Negative for fever.  Skin: Positive for wound.      Allergies  Review of patient's allergies indicates no known allergies.  Home Medications   Prior to Admission medications   Medication Sig Start Date End Date Taking? Authorizing Provider  ketoconazole (NIZORAL) 2 % cream Apply 1 application topically daily as needed for irritation.     Historical Provider, MD  STRIBILD 150-150-200-300 MG TABS tablet TAKE 1 TABLET BY MOUTH EVERY DAY WITH BREAKFAST 06/07/14   [name], MD   BP 129/70 mmHg  Pulse 64  Temp(Src) 97.8 F (36.6 C) (Oral)  Resp 16  Ht 5\' 11"  (1.803 m)  Wt 219 lb (99.338 kg)  BMI 30.56 kg/m2  SpO2 99% Physical Exam  Constitutional: He is oriented to person, place, and time. He appears well-developed and well-nourished. No  distress.  HENT:  Head: Normocephalic and atraumatic.  Eyes: Conjunctivae and EOM are normal.  Neck: Neck supple.  Cardiovascular: Normal rate.   Pulmonary/Chest: Breath sounds normal. No respiratory distress.  Neurological: He is alert and oriented to person, place, and time.  Skin: Skin is warm.  Psychiatric: His behavior is normal.  Nursing note and vitals reviewed.   ED Course  Procedures  DIAGNOSTIC STUDIES: Oxygen Saturation is 99% on RA, normal by my interpretation.    COORDINATION OF CARE: 10:56 PM Discussed plan to perform suture removal.  Pt agreed to plan.  Labs Review Labs Reviewed - No data to display  Imaging Review No results found.   EKG Interpretation None      MDM   Final diagnoses:  Visit for suture removal   Staple removal   Pt to ER for staple/suture removal and wound check as above. Procedure tolerated well. Vitals normal, no signs of infection. Scar minimization & return precautions given at dc.   I personally performed the services described in this documentation, which was scribed in my presence. The recorded information has been reviewed and is accurate.        Arthor Captainbigail Josean Lycan, PA-C 12/21/14 1729  Eber HongBrian Miller, MD 12/23/14 [phone]

## 2014-12-17 NOTE — ED Notes (Addendum)
Pt. is here for suture removal at right middle finger , laceration healed , well approximated with no drainage .

## 2014-12-17 NOTE — Discharge Instructions (Signed)
Scar Minimization °You will have a scar anytime you have surgery and a cut is made in the skin or you have something removed from your skin (mole, skin cancer, cyst). Although scars are unavoidable following surgery, there are ways to minimize their appearance. °It is important to follow all the instructions you receive from your caregiver about wound care. How your wound heals will influence the appearance of your scar. If you do not follow the wound care instructions as directed, complications such as infection may occur. Wound instructions include keeping the wound clean, moist, and not letting the wound form a scab. Some people form scars that are raised and lumpy (hypertrophic) or larger than the initial wound (keloidal). °HOME CARE INSTRUCTIONS  °· Follow wound care instructions as directed. °· Keep the wound clean by washing it with soap and water. °· Keep the wound moist with provided antibiotic cream or petroleum jelly until completely healed. Moisten twice a day for about 2 weeks. °· Get stitches (sutures) taken out at the scheduled time. °· Avoid touching or manipulating your wound unless needed. Wash your hands thoroughly before and after touching your wound. °· Follow all restrictions such as limits on exercise or work. This depends on where your scar is located. °· Keep the scar protected from sunburn. Cover the scar with sunscreen/sunblock with SPF 30 or higher. °· Gently massage the scar using a circular motion to help minimize the appearance of the scar. Do this only after the wound has closed and all the sutures have been removed. °· For hypertrophic or keloidal scars, there are several ways to treat and minimize their appearance. Methods include compression therapy, intralesional corticosteroids, laser therapy, or surgery. These methods are performed by your caregiver. °Remember that the scar may appear lighter or darker than your normal skin color. This difference in color should even out with  time. °SEEK MEDICAL CARE IF:  °· You have a fever. °· You develop signs of infection such as pain, redness, pus, and warmth. °· You have questions or concerns. °Document Released: 12/30/2009 Document Revised: 10/04/2011 Document Reviewed: 12/30/2009 °ExitCare® Patient Information ©2015 [employer]. This information is not intended to replace advice given to you by your health care provider. Make sure you discuss any questions you have with your health care provider. ° °Incision Care °An incision is when a surgeon cuts into your body tissues. After surgery, the incision needs to be cared for properly to prevent infection.  °HOME CARE INSTRUCTIONS  °· Take all medicine as directed by your caregiver. Only take over-the-counter or prescription medicines for pain, discomfort, or fever as directed by your caregiver. °· Do not remove your bandage (dressing) or get your incision wet until your surgeon gives you permission. In the event that your dressing becomes wet, dirty, or starts to smell, change the dressing and call your surgeon for instructions as soon as possible. °· Take showers. Do not take tub baths, swim, or do anything that may soak the wound until it is healed. °· Resume your normal diet and activities as directed or allowed. °· Avoid lifting any weight until you are instructed otherwise. °· Use anti-itch antihistamine medicine as directed by your caregiver. The wound may itch when it is healing. Do not pick or scratch at the wound. °· Follow up with your caregiver for stitch (suture) or staple removal as directed. °· Drink enough fluids to keep your urine clear or pale yellow. °SEEK MEDICAL CARE IF:  °· You have redness, swelling, or   increasing pain in the wound that is not controlled with medicine. °· You have drainage, blood, or pus coming from the wound that lasts longer than 1 day. °· You develop muscle aches, chills, or a general ill feeling. °· You notice a bad smell coming from the wound or  dressing. °· Your wound edges separate after the sutures, staples, or skin adhesive strips have been removed. °· You develop persistent nausea or vomiting. °SEEK IMMEDIATE MEDICAL CARE IF:  °· You have a fever. °· You develop a rash. °· You develop dizzy episodes or faint while standing. °· You have difficulty breathing. °· You develop any reaction or side effects to medicine given. °MAKE SURE YOU:  °· Understand these instructions. °· Will watch your condition. °· Will get help right away if you are not doing well or get worse. °Document Released: 01/29/2005 Document Revised: 10/04/2011 Document Reviewed: 09/05/2013 °ExitCare® Patient Information ©2015 [employer]. This information is not intended to replace advice given to you by your health care provider. Make sure you discuss any questions you have with your health care provider. ° °

## 2014-12-17 NOTE — ED Notes (Signed)
Pt left with all belongings and refused wheelchair. 

## 2015-01-31 ENCOUNTER — Other Ambulatory Visit: Payer: Self-pay | Admitting: Internal Medicine

## 2015-01-31 DIAGNOSIS — B20 Human immunodeficiency virus [HIV] disease: Secondary | ICD-10-CM

## 2015-03-18 ENCOUNTER — Ambulatory Visit: Payer: BC Managed Care – PPO | Admitting: Internal Medicine

## 2015-04-08 ENCOUNTER — Ambulatory Visit: Payer: BC Managed Care – PPO | Admitting: Internal Medicine

## 2015-05-22 ENCOUNTER — Other Ambulatory Visit (HOSPITAL_COMMUNITY)
Admission: RE | Admit: 2015-05-22 | Discharge: 2015-05-22 | Disposition: A | Discharge status: Home / Self Care | Payer: Medicaid Other | Source: Ambulatory Visit | Attending: Internal Medicine | Admitting: Internal Medicine

## 2015-05-22 ENCOUNTER — Ambulatory Visit (INDEPENDENT_AMBULATORY_CARE_PROVIDER_SITE_OTHER): Payer: Medicaid Other | Admitting: Internal Medicine

## 2015-05-22 ENCOUNTER — Encounter: Payer: Self-pay | Admitting: Internal Medicine

## 2015-05-22 VITALS — BP 116/67 | HR 55 | Temp 97.8°F | Wt 226.0 lb

## 2015-05-22 DIAGNOSIS — B20 Human immunodeficiency virus [HIV] disease: Secondary | ICD-10-CM | POA: Diagnosis present

## 2015-05-22 DIAGNOSIS — Z8619 Personal history of other infectious and parasitic diseases: Secondary | ICD-10-CM | POA: Diagnosis not present

## 2015-05-22 DIAGNOSIS — Z113 Encounter for screening for infections with a predominantly sexual mode of transmission: Secondary | ICD-10-CM | POA: Diagnosis not present

## 2015-05-22 DIAGNOSIS — Z23 Encounter for immunization: Secondary | ICD-10-CM | POA: Diagnosis not present

## 2015-05-22 LAB — CBC WITH DIFFERENTIAL/PLATELET
BASOS ABS: 0 10*3/uL (ref 0.0–0.1)
Basophils Relative: 0 % (ref 0–1)
Eosinophils Absolute: 0.1 10*3/uL (ref 0.0–0.7)
Eosinophils Relative: 2 % (ref 0–5)
HEMATOCRIT: 42.2 % (ref 39.0–52.0)
Hemoglobin: 14.2 g/dL (ref 13.0–17.0)
LYMPHS ABS: 2.3 10*3/uL (ref 0.7–4.0)
LYMPHS PCT: 42 % (ref 12–46)
MCH: 27.3 pg (ref 26.0–34.0)
MCHC: 33.6 g/dL (ref 30.0–36.0)
MCV: 81 fL (ref 78.0–100.0)
MPV: 10.4 fL (ref 8.6–12.4)
Monocytes Absolute: 0.5 10*3/uL (ref 0.1–1.0)
Monocytes Relative: 10 % (ref 3–12)
NEUTROS ABS: 2.5 10*3/uL (ref 1.7–7.7)
NEUTROS PCT: 46 % (ref 43–77)
PLATELETS: 211 10*3/uL (ref 150–400)
RBC: 5.21 MIL/uL (ref 4.22–5.81)
RDW: 13.4 % (ref 11.5–15.5)
WBC: 5.4 10*3/uL (ref 4.0–10.5)

## 2015-05-22 LAB — COMPLETE METABOLIC PANEL WITH GFR
ALT: 22 U/L (ref 9–46)
AST: 24 U/L (ref 10–40)
Albumin: 4.7 g/dL (ref 3.6–5.1)
Alkaline Phosphatase: 38 U/L — ABNORMAL LOW (ref 40–115)
BUN: 15 mg/dL (ref 7–25)
CALCIUM: 9.4 mg/dL (ref 8.6–10.3)
CO2: 28 mmol/L (ref 20–31)
Chloride: 103 mmol/L (ref 98–110)
Creat: 1.1 mg/dL (ref 0.60–1.35)
GFR, Est Non African American: >89 mL/min (ref >=60)
Glucose, Bld: 75 mg/dL (ref 65–99)
Potassium: 4.4 mmol/L (ref 3.5–5.3)
Sodium: 138 mmol/L (ref 135–146)
TOTAL PROTEIN: 7.5 g/dL (ref 6.1–8.1)
Total Bilirubin: 0.8 mg/dL (ref 0.2–1.2)

## 2015-05-22 LAB — LIPID PANEL
CHOLESTEROL: 137 mg/dL (ref 125–170)
HDL: 40 mg/dL (ref >=40)
LDL Cholesterol: 87 mg/dL (ref <110)
Total CHOL/HDL Ratio: 3.4 Ratio (ref <=5.0)
Triglycerides: 49 mg/dL (ref <150)
VLDL: 10 mg/dL (ref <30)

## 2015-05-22 MED ORDER — HPV QUADRIVALENT VACCINE IM SUSP
0.5000 mL | Freq: Once | INTRAMUSCULAR | Status: AC
  Administered 2015-05-22: 0.5 mL via INTRAMUSCULAR

## 2015-05-22 NOTE — Progress Notes (Signed)
Patient ID: Gean Birchwoodarahje Fonte, male   DOB: 02/22/1996, 19 y.o.   MRN: [id number]       Patient ID: Gean Birchwoodarahje Tallon, male   DOB: 12/03/1995, 19 y.o.   MRN: [id number]  HPI 19 yo M with HIV disease, CD 4 count of 1020/VL<20, (may 2016). Hx of syphilis. Well controlled on stribild. He states that he does not have any rash presently. He is doing well with school and working. He is finding his course work more difficult this semester than last year. He denies any recent unprotected sex. Not in a relationship. He denies any recent illnesses.  Outpatient Encounter Prescriptions as of 05/22/2015  Medication Sig  . ketoconazole (NIZORAL) 2 % cream Apply 1 application topically daily as needed for irritation.   . STRIBILD 150-150-200-300 MG TABS tablet TAKE 1 TABLET BY MOUTH EVERY DAY WITH BREAKFAST   No facility-administered encounter medications on file as of 05/22/2015.     Patient Active Problem List   Diagnosis Date Noted  . Human immunodeficiency virus (HIV) disease (HCC) 05/01/2013  . Syphilis 05/01/2013     Health Maintenance Due  Topic Date Due  . TETANUS/TDAP  04/26/2014  . INFLUENZA VACCINE  02/24/2015     Review of Systems  Constitutional: Negative for fever, chills, diaphoresis, activity change, appetite change, fatigue and unexpected weight change.  HENT: Negative for congestion, sore throat, rhinorrhea, sneezing, trouble swallowing and sinus pressure.  Eyes: Negative for photophobia and visual disturbance.  Respiratory: Negative for cough, chest tightness, shortness of breath, wheezing and stridor.  Cardiovascular: Negative for chest pain, palpitations and leg swelling.  Gastrointestinal: Negative for nausea, vomiting, abdominal pain, diarrhea, constipation, blood in stool, abdominal distention and anal bleeding.  Genitourinary: Negative for dysuria, hematuria, flank pain and difficulty urinating.  Musculoskeletal: Negative for myalgias, back pain, joint swelling, arthralgias and  gait problem.  Skin: Negative for color change, pallor, rash and wound.  Neurological: Negative for dizziness, tremors, weakness and light-headedness.  Hematological: Negative for adenopathy. Does not bruise/bleed easily.  Psychiatric/Behavioral: Negative for behavioral problems, confusion, sleep disturbance, dysphoric mood, decreased concentration and agitation.    Physical Exam   BP 116/67 mmHg  Pulse 55  Temp(Src) 97.8 F (36.6 C) (Oral)  Wt 226 lb (102.513 kg) Physical Exam  Constitutional: He is oriented to person, place, and time. He appears well-developed and well-nourished. No distress.  HENT:  Mouth/Throat: Oropharynx is clear and moist. No oropharyngeal exudate.  Cardiovascular: Normal rate, regular rhythm and normal heart sounds. Exam reveals no gallop and no friction rub.  No murmur heard.  Pulmonary/Chest: Effort normal and breath sounds normal. No respiratory distress. He has no wheezes.  Abdominal: Soft. Bowel sounds are normal. He exhibits no distension. There is no tenderness.  Lymphadenopathy:  He has no cervical adenopathy.  Neurological: He is alert and oriented to person, place, and time.  Skin: Skin is warm and dry. No rash noted. No erythema.  Psychiatric: He has a normal mood and affect. His behavior is normal.    Lab Results  Component Value Date   CD4TCELL 35 11/27/2014   Lab Results  Component Value Date   CD4TABS 1020 11/27/2014   CD4TABS 780 04/23/2014   CD4TABS 680 11/28/2013   Lab Results  Component Value Date   HIV1RNAQUANT <20 11/27/2014   Lab Results  Component Value Date   HEPBSAB POS* 04/19/2013   No results found for: RPR  CBC Lab Results  Component Value Date   WBC 7.0 11/27/2014   RBC  5.46 11/27/2014   HGB 14.5 11/27/2014   HCT 44.6 11/27/2014   PLT 216 11/27/2014   MCV 81.7 11/27/2014   MCH 26.6 11/27/2014   MCHC 32.5 11/27/2014   RDW 13.9 11/27/2014   LYMPHSABS 2.9 11/27/2014   MONOABS 0.6 11/27/2014   EOSABS 0.2  11/27/2014   BASOSABS 0.0 11/27/2014   BMET Lab Results  Component Value Date   NA 139 11/27/2014   K 4.1 11/27/2014   CL 103 11/27/2014   CO2 27 11/27/2014   GLUCOSE 74 11/27/2014   BUN 14 11/27/2014   CREATININE 1.10 11/27/2014   CALCIUM 9.8 11/27/2014   GFRNONAA >89 11/27/2014   GFRAA >89 11/27/2014     Assessment and Plan   hiv disease= will switch to genvoya. Get labs today  Health maintenance = will give flu shot. Will start hpv vaccine, #1 today, #2 in 4 wk  Hx of syphilis = will check rpr

## 2015-05-23 LAB — HIV-1 RNA QUANT-NO REFLEX-BLD
HIV 1 RNA QUANT: 39 {copies}/mL — AB (ref <20)
HIV-1 RNA QUANT, LOG: 1.59 {Log_copies}/mL — AB (ref <1.30)

## 2015-05-23 LAB — T-HELPER CELL (CD4) - (RCID CLINIC ONLY)
CD4 % Helper T Cell: 40 % (ref 33–55)
CD4 T CELL ABS: 940 /uL (ref 400–2700)

## 2015-05-23 LAB — RPR

## 2015-05-26 LAB — URINE CYTOLOGY ANCILLARY ONLY
CHLAMYDIA, DNA PROBE: NEGATIVE
NEISSERIA GONORRHEA: NEGATIVE

## 2015-06-23 ENCOUNTER — Ambulatory Visit: Payer: Medicaid Other

## 2015-08-04 ENCOUNTER — Encounter (HOSPITAL_COMMUNITY): Payer: Self-pay

## 2015-08-04 ENCOUNTER — Emergency Department (HOSPITAL_COMMUNITY)
Admission: EM | Admit: 2015-08-04 | Discharge: 2015-08-04 | Disposition: A | Discharge status: Home / Self Care | Payer: Medicaid Other | Attending: Emergency Medicine | Admitting: Emergency Medicine

## 2015-08-04 DIAGNOSIS — R112 Nausea with vomiting, unspecified: Secondary | ICD-10-CM | POA: Diagnosis not present

## 2015-08-04 DIAGNOSIS — Z79899 Other long term (current) drug therapy: Secondary | ICD-10-CM | POA: Insufficient documentation

## 2015-08-04 DIAGNOSIS — B20 Human immunodeficiency virus [HIV] disease: Secondary | ICD-10-CM | POA: Insufficient documentation

## 2015-08-04 DIAGNOSIS — F17210 Nicotine dependence, cigarettes, uncomplicated: Secondary | ICD-10-CM | POA: Diagnosis not present

## 2015-08-04 DIAGNOSIS — Z862 Personal history of diseases of the blood and blood-forming organs and certain disorders involving the immune mechanism: Secondary | ICD-10-CM | POA: Diagnosis not present

## 2015-08-04 DIAGNOSIS — Z872 Personal history of diseases of the skin and subcutaneous tissue: Secondary | ICD-10-CM | POA: Diagnosis not present

## 2015-08-04 DIAGNOSIS — A09 Infectious gastroenteritis and colitis, unspecified: Secondary | ICD-10-CM

## 2015-08-04 LAB — COMPREHENSIVE METABOLIC PANEL
ALBUMIN: 4.1 g/dL (ref 3.5–5.0)
ALT: 21 U/L (ref 17–63)
AST: 23 U/L (ref 15–41)
Alkaline Phosphatase: 39 U/L (ref 38–126)
Anion gap: 9 (ref 5–15)
BUN: 13 mg/dL (ref 6–20)
CHLORIDE: 104 mmol/L (ref 101–111)
CO2: 25 mmol/L (ref 22–32)
Calcium: 8.9 mg/dL (ref 8.9–10.3)
Creatinine, Ser: 0.98 mg/dL (ref 0.61–1.24)
GFR calc Af Amer: >60 mL/min (ref >60)
GFR calc non Af Amer: >60 mL/min (ref >60)
GLUCOSE: 93 mg/dL (ref 65–99)
POTASSIUM: 3.9 mmol/L (ref 3.5–5.1)
Sodium: 138 mmol/L (ref 135–145)
Total Bilirubin: 0.5 mg/dL (ref 0.3–1.2)
Total Protein: 6.7 g/dL (ref 6.5–8.1)

## 2015-08-04 LAB — CBC WITH DIFFERENTIAL/PLATELET
BASOS ABS: 0 10*3/uL (ref 0.0–0.1)
Basophils Relative: 0 %
EOS PCT: 1 %
Eosinophils Absolute: 0.1 10*3/uL (ref 0.0–0.7)
HCT: 42.3 % (ref 39.0–52.0)
Hemoglobin: 13.4 g/dL (ref 13.0–17.0)
Lymphocytes Relative: 24 %
Lymphs Abs: 1.3 10*3/uL (ref 0.7–4.0)
MCH: 26.1 pg (ref 26.0–34.0)
MCHC: 31.7 g/dL (ref 30.0–36.0)
MCV: 82.5 fL (ref 78.0–100.0)
MONOS PCT: 13 %
Monocytes Absolute: 0.7 10*3/uL (ref 0.1–1.0)
Neutro Abs: 3.4 10*3/uL (ref 1.7–7.7)
Neutrophils Relative %: 62 %
PLATELETS: 161 10*3/uL (ref 150–400)
RBC: 5.13 MIL/uL (ref 4.22–5.81)
RDW: 13 % (ref 11.5–15.5)
WBC: 5.5 10*3/uL (ref 4.0–10.5)

## 2015-08-04 LAB — LIPASE, BLOOD: LIPASE: 19 U/L (ref 11–51)

## 2015-08-04 MED ORDER — ONDANSETRON HCL 4 MG PO TABS: 4.0000 mg | ORAL_TABLET | Freq: Four times a day (QID) | ORAL | Status: DC

## 2015-08-04 MED ORDER — DIPHENOXYLATE-ATROPINE 2.5-0.025 MG PO TABS: 2.0000 | ORAL_TABLET | Freq: Four times a day (QID) | ORAL | Status: DC | PRN

## 2015-08-04 MED ORDER — DIPHENOXYLATE-ATROPINE 2.5-0.025 MG PO TABS
2.0000 | ORAL_TABLET | Freq: Once | ORAL | Status: AC
  Administered 2015-08-04: 2 via ORAL
  Filled 2015-08-04: qty 2

## 2015-08-04 MED ORDER — ONDANSETRON HCL 4 MG/2ML IJ SOLN
4.0000 mg | Freq: Once | INTRAMUSCULAR | Status: AC
Start: 2015-08-04 — End: 2015-08-04
  Administered 2015-08-04: 4 mg via INTRAVENOUS
  Filled 2015-08-04: qty 2

## 2015-08-04 MED ORDER — SODIUM CHLORIDE 0.9 % IV BOLUS (SEPSIS)
1000.0000 mL | Freq: Once | INTRAVENOUS | Status: AC
  Administered 2015-08-04: 1000 mL via INTRAVENOUS

## 2015-08-04 NOTE — ED Provider Notes (Signed)
CSN: [id number]     Arrival date & time 08/04/15  4098 History   First MD Initiated Contact with Patient 08/04/15 [phone]     Chief Complaint  Patient presents with  . Nausea  . Diarrhea  . Emesis     (Consider location/radiation/quality/duration/timing/severity/associated sxs/prior Treatment) HPI Comments: Patient presents to the ER for evaluation of nausea, vomiting, diarrhea. Symptoms began earlier today. He has reported persistent symptoms through the course of the day. He reports intermittent abdominal cramping and "bubbling" associated with vomiting. No persistent abdominal pain. He has not had a fever. There are no upper respiratory symptoms.  Patient is a 20 y.o. male presenting with diarrhea and vomiting.  Diarrhea Associated symptoms: vomiting   Emesis Associated symptoms: diarrhea     Past Medical History  Diagnosis Date  . Immune deficiency disorder (HCC)   . HIV (human immunodeficiency virus infection) (HCC)   . Skin abscess     01-25-14  recheck on 01-28-14   History reviewed. No pertinent past surgical history. No family history on file. Social History  Substance Use Topics  . Smoking status: Current Some Day Smoker    Types: Cigarettes  . Smokeless tobacco: Never Used     Comment: smoked due to stress   . Alcohol Use: No    Review of Systems  Gastrointestinal: Positive for nausea, vomiting and diarrhea.  All other systems reviewed and are negative.     Allergies  Review of patient's allergies indicates no known allergies.  Home Medications   Prior to Admission medications   Medication Sig Start Date End Date Taking? Authorizing Provider  ketoconazole (NIZORAL) 2 % cream Apply 1 application topically daily as needed for irritation.     Historical Provider, MD  STRIBILD 150-150-200-300 MG TABS tablet TAKE 1 TABLET BY MOUTH EVERY DAY WITH BREAKFAST 02/03/15   Judyann Munson, MD   BP 118/90 mmHg  Pulse 64  Temp(Src) 98.5 F (36.9 C) (Oral)  Resp 16  Ht  5\' 11"  (1.803 m)  Wt 210 lb (95.255 kg)  BMI 29.30 kg/m2  SpO2 99% Physical Exam  Constitutional: He is oriented to person, place, and time. He appears well-developed and well-nourished. No distress.  HENT:  Head: Normocephalic and atraumatic.  Right Ear: Hearing normal.  Left Ear: Hearing normal.  Nose: Nose normal.  Mouth/Throat: Oropharynx is clear and moist and mucous membranes are normal.  Eyes: Conjunctivae and EOM are normal. Pupils are equal, round, and reactive to light.  Neck: Normal range of motion. Neck supple.  Cardiovascular: Regular rhythm, S1 normal and S2 normal.  Exam reveals no gallop and no friction rub.   No murmur heard. Pulmonary/Chest: Effort normal and breath sounds normal. No respiratory distress. He exhibits no tenderness.  Abdominal: Soft. Normal appearance and bowel sounds are normal. There is no hepatosplenomegaly. There is no tenderness. There is no rebound, no guarding, no tenderness at McBurney's point and negative Murphy's sign. No hernia.  Musculoskeletal: Normal range of motion.  Neurological: He is alert and oriented to person, place, and time. He has normal strength. No cranial nerve deficit or sensory deficit. Coordination normal. GCS eye subscore is 4. GCS verbal subscore is 5. GCS motor subscore is 6.  Skin: Skin is warm, dry and intact. No rash noted. No cyanosis.  Psychiatric: He has a normal mood and affect. His speech is normal and behavior is normal. Thought content normal.  Nursing note and vitals reviewed.   ED Course  Procedures (including critical care time)  Labs Review Labs Reviewed  CBC WITH DIFFERENTIAL/PLATELET  COMPREHENSIVE METABOLIC PANEL  URINALYSIS, ROUTINE W REFLEX MICROSCOPIC (NOT AT [facility])  LIPASE, BLOOD    Imaging Review No results found. I have personally reviewed and evaluated these images and lab results as part of my medical decision-making.   EKG Interpretation None      MDM   Final diagnoses:  None    vomiting Diarrhea  Patient presents to the emergency department for evaluation of nausea, vomiting or diarrhea. He has had intermittent abdominal cramping, however, abdominal exam is benign. He does not have any tenderness. His lab work is unremarkable. Vital signs are normal. This is consistent with a simple viral process. Patient does have HIV disease, but labs 2 months ago showed reasonably good response to medication. He had normal T4 helper cells and HIV RNA of 39/1.59. It is therefore felt that this is unlikely to be HIV related. He was administered IV fluids and symptomatic treatment. Continue symptomatic treatment at home, follow-up with infectious disease.    Gilda Creasehristopher J Rishard Delange, MD 08/04/15 [phone]

## 2015-08-04 NOTE — Discharge Instructions (Signed)

## 2015-08-04 NOTE — ED Notes (Signed)
Pt c/o n/v/d that started today. Denies fevers and cough. Denies abd pain in one area.

## 2015-08-22 ENCOUNTER — Emergency Department (HOSPITAL_COMMUNITY)
Admission: EM | Admit: 2015-08-22 | Discharge: 2015-08-22 | Discharge status: Against Medical Advice | Payer: Medicaid Other | Attending: Emergency Medicine | Admitting: Emergency Medicine

## 2015-08-22 ENCOUNTER — Encounter (HOSPITAL_COMMUNITY): Payer: Self-pay | Admitting: *Deleted

## 2015-08-22 DIAGNOSIS — Z21 Asymptomatic human immunodeficiency virus [HIV] infection status: Secondary | ICD-10-CM | POA: Diagnosis not present

## 2015-08-22 DIAGNOSIS — R51 Headache: Secondary | ICD-10-CM | POA: Diagnosis not present

## 2015-08-22 DIAGNOSIS — B20 Human immunodeficiency virus [HIV] disease: Secondary | ICD-10-CM

## 2015-08-22 DIAGNOSIS — Z5329 Procedure and treatment not carried out because of patient's decision for other reasons: Secondary | ICD-10-CM

## 2015-08-22 DIAGNOSIS — F17210 Nicotine dependence, cigarettes, uncomplicated: Secondary | ICD-10-CM | POA: Insufficient documentation

## 2015-08-22 DIAGNOSIS — Z79899 Other long term (current) drug therapy: Secondary | ICD-10-CM | POA: Insufficient documentation

## 2015-08-22 DIAGNOSIS — Z872 Personal history of diseases of the skin and subcutaneous tissue: Secondary | ICD-10-CM | POA: Diagnosis not present

## 2015-08-22 DIAGNOSIS — R519 Headache, unspecified: Secondary | ICD-10-CM

## 2015-08-22 DIAGNOSIS — Z5320 Procedure and treatment not carried out because of patient's decision for unspecified reasons: Secondary | ICD-10-CM

## 2015-08-22 MED ORDER — TRAMADOL HCL 50 MG PO TABS: 50.0000 mg | ORAL_TABLET | Freq: Four times a day (QID) | ORAL | Status: DC | PRN

## 2015-08-22 MED ORDER — METOCLOPRAMIDE HCL 5 MG/ML IJ SOLN: 10.0000 mg | Freq: Once | INTRAMUSCULAR | Status: DC

## 2015-08-22 MED ORDER — KETOROLAC TROMETHAMINE 60 MG/2ML IM SOLN: 60.0000 mg | Freq: Once | INTRAMUSCULAR | Status: DC

## 2015-08-22 MED ORDER — DIPHENHYDRAMINE HCL 50 MG/ML IJ SOLN: 25.0000 mg | Freq: Once | INTRAMUSCULAR | Status: DC

## 2015-08-22 NOTE — ED Notes (Signed)
Pt. Requested to leave and sts he did not know it would take this long. Pt. Advised of consequences of leaving without further evaluation. Pt. Signed out AMA.

## 2015-08-22 NOTE — Discharge Instructions (Signed)
General Headache Without Cause A headache is pain or discomfort felt around the head or neck area. There are many causes and types of headaches. In some cases, the cause may not be found.  HOME CARE  Managing Pain  Take over-the-counter and prescription medicines only as told by your doctor.  Lie down in a dark, quiet room when you have a headache.  If directed, apply ice to the head and neck area:  Put ice in a plastic bag.  Place a towel between your skin and the bag.  Leave the ice on for 20 minutes, 2-3 times per day.  Use a heating pad or hot shower to apply heat to the head and neck area as told by your doctor.  Keep lights dim if bright lights bother you or make your headaches worse. Eating and Drinking  Eat meals on a regular schedule.  Lessen how much alcohol you drink.  Lessen how much caffeine you drink, or stop drinking caffeine. General Instructions  Keep all follow-up visits as told by your doctor. This is important.  Keep a journal to find out if certain things bring on headaches. For example, write down:  What you eat and drink.  How much sleep you get.  Any change to your diet or medicines.  Relax by getting a massage or doing other relaxing activities.  Lessen stress.  Sit up straight. Do not tighten (tense) your muscles.  Do not use tobacco products. This includes cigarettes, chewing tobacco, or e-cigarettes. If you need help quitting, ask your doctor.  Exercise regularly as told by your doctor.  Get enough sleep. This often means 7-9 hours of sleep. GET HELP IF:  Your symptoms are not helped by medicine.  You have a headache that feels different than the other headaches.  You feel sick to your stomach (nauseous) or you throw up (vomit).  You have a fever. GET HELP RIGHT AWAY IF:   Your headache becomes really bad.  You keep throwing up.  You have a stiff neck.  You have trouble seeing.  You have trouble speaking.  You have  pain in the eye or ear.  Your muscles are weak or you lose muscle control.  You lose your balance or have trouble walking.  You feel like you will pass out (faint) or you pass out.  You have confusion.   This information is not intended to replace advice given to you by your health care provider. Make sure you discuss any questions you have with your health care provider.   Document Released: 04/20/2008 Document Revised: 04/02/2015 Document Reviewed: 11/04/2014 Elsevier Interactive Patient Education 2016 [employer]. Acknowledgement of Risk of Discharge  Against Medical Advice   And Release of Liability    Because I am choosing to leave the hospital in spite of these risks, I release the hospital, its employees and officers, and my attending physician from all liability for any adverse results caused by my leaving the hospital prematurely.   ________________________________      _____________________________________ Patient's Signature                                        Date and Time   ________________________________      _____________________________________ Witness' Signature  Relationship to Patient    ________________________________      _____________________________________ Witness' Signature                                        Relationship to Patient   [If patient refuses to sign, write "Patient refused to sign" on the patient's signature line and have witnesses to the refusal sign as witnesses.]

## 2015-08-22 NOTE — ED Notes (Signed)
Pt reports severe headache for several days.denies hx of migraines. Denies n/v or fever.

## 2015-08-22 NOTE — ED Provider Notes (Signed)
CSN: [id number]     Arrival date & time 08/22/15  1059 History   First MD Initiated Contact with Patient 08/22/15 1232     Chief Complaint  Patient presents with  . Headache     (Consider location/radiation/quality/duration/timing/severity/associated sxs/prior Treatment) HPI Patient has had a left sided throbbing headache for 3 days. He reports it's been continuous. It waxes and wanes somewhat in severity. Denies relief with over-the-counter medications. No nausea, vomiting or fever. No neck stiffness. No confusion or visual changes. No recent illness. No sinus congestion or drainage, no sore throat or earache. he does have history of HIV. He reports he is compliant with medications. Past Medical History  Diagnosis Date  . Immune deficiency disorder (HCC)   . HIV (human immunodeficiency virus infection) (HCC)   . Skin abscess     01-25-14  recheck on 01-28-14   History reviewed. No pertinent past surgical history. History reviewed. No pertinent family history. Social History  Substance Use Topics  . Smoking status: Current Some Day Smoker    Types: Cigarettes  . Smokeless tobacco: Never Used     Comment: smoked due to stress   . Alcohol Use: No    Review of Systems 10 Systems reviewed and are negative for acute change except as noted in the HPI.    Allergies  Review of patient's allergies indicates no known allergies.  Home Medications   Prior to Admission medications   Medication Sig Start Date End Date Taking? Authorizing Provider  diphenoxylate-atropine (LOMOTIL) 2.5-0.025 MG tablet Take 2 tablets by mouth 4 (four) times daily as needed for diarrhea or loose stools. 08/04/15   Gilda Crease, MD  ketoconazole (NIZORAL) 2 % cream Apply 1 application topically daily as needed for irritation.     Historical Provider, MD  ondansetron (ZOFRAN) 4 MG tablet Take 1 tablet (4 mg total) by mouth every 6 (six) hours. 08/04/15   Gilda Crease, MD  STRIBILD 150-150-200-300  MG TABS tablet TAKE 1 TABLET BY MOUTH EVERY DAY WITH BREAKFAST 02/03/15   Judyann Munson, MD  traMADol (ULTRAM) 50 MG tablet Take 1 tablet (50 mg total) by mouth every 6 (six) hours as needed. 08/22/15   Arby Barrette, MD   BP 128/67 mmHg  Pulse 48  Temp(Src) 97.6 F (36.4 C) (Oral)  Resp 15  Ht  (1.803 m)  Wt 213 lb 1.6 oz (96.662 kg)  BMI 29.73 kg/m2  SpO2 99% Physical Exam  Constitutional: He is oriented to person, place, and time. He appears well-developed and well-nourished.  HENT:  Head: Normocephalic and atraumatic.  Right Ear: External ear normal.  Left Ear: External ear normal.  Nose: Nose normal.  Mouth/Throat: Oropharynx is clear and moist. No oropharyngeal exudate.  Bilateral TMs are normal. neck is supple without lymphadenopathy.   Eyes: EOM are normal. Pupils are equal, round, and reactive to light.  Neck: Neck supple.  Cardiovascular: Normal rate, regular rhythm, normal heart sounds and intact distal pulses.   Pulmonary/Chest: Effort normal and breath sounds normal.  Abdominal: Soft. Bowel sounds are normal. He exhibits no distension. There is no tenderness.  Musculoskeletal: Normal range of motion. He exhibits no edema.  Neurological: He is alert and oriented to person, place, and time. He has normal strength. No cranial nerve deficit. He exhibits normal muscle tone. Coordination normal. GCS eye subscore is 4. GCS verbal subscore is 5. GCS motor subscore is 6.  Skin: Skin is warm, dry and intact.  Psychiatric: He has a  normal mood and affect.    ED Course  Procedures (including critical care time) Labs Review Labs Reviewed - No data to display  Imaging Review No results found. I have personally reviewed and evaluated these images and lab results as part of my medical decision-making.   EKG Interpretation None      MDM   Final diagnoses:  Acute nonintractable headache, unspecified headache type  HIV disease (HCC)  Left against medical advice    Patient  presents with headache. There are not concerning associated symptoms. The patient is nontoxic and alert with normal mental status. He has not had fever, neck stiffness or mental status change. Due to the patient's history of HIV and no prior headache history, I did wish to pursue CT scan to make sure there were no occult intracranial lesions or atypical presentation of sinusitis. Patient however informed nursing that he wished to leave AMA before the CT scan. He did not make me aware of any desire not to perform CT or ongoing treatment during time of evaluation and discussion of management. I had advised the patient that if his CT did not show any concerning findings, his plan would be to be discharged with medication for pain and instructions regarding headache with recommendation to call his infectious disease doctor to schedule follow-up this upcoming week.  Arby Barrette, MD 08/22/15 1425

## 2015-08-28 ENCOUNTER — Ambulatory Visit: Payer: Medicaid Other | Admitting: Internal Medicine

## 2015-09-25 ENCOUNTER — Ambulatory Visit: Payer: Medicaid Other | Admitting: Internal Medicine

## 2015-10-08 ENCOUNTER — Emergency Department (HOSPITAL_COMMUNITY)
Admission: EM | Admit: 2015-10-08 | Discharge: 2015-10-08 | Disposition: A | Discharge status: Home / Self Care | Payer: Medicaid Other | Attending: Emergency Medicine | Admitting: Emergency Medicine

## 2015-10-08 ENCOUNTER — Encounter (HOSPITAL_COMMUNITY): Payer: Self-pay | Admitting: *Deleted

## 2015-10-08 DIAGNOSIS — B20 Human immunodeficiency virus [HIV] disease: Secondary | ICD-10-CM | POA: Diagnosis not present

## 2015-10-08 DIAGNOSIS — F17210 Nicotine dependence, cigarettes, uncomplicated: Secondary | ICD-10-CM | POA: Diagnosis not present

## 2015-10-08 DIAGNOSIS — Z79899 Other long term (current) drug therapy: Secondary | ICD-10-CM | POA: Diagnosis not present

## 2015-10-08 DIAGNOSIS — R1110 Vomiting, unspecified: Secondary | ICD-10-CM | POA: Diagnosis present

## 2015-10-08 DIAGNOSIS — K529 Noninfective gastroenteritis and colitis, unspecified: Secondary | ICD-10-CM | POA: Diagnosis not present

## 2015-10-08 DIAGNOSIS — Z862 Personal history of diseases of the blood and blood-forming organs and certain disorders involving the immune mechanism: Secondary | ICD-10-CM | POA: Diagnosis not present

## 2015-10-08 DIAGNOSIS — Z872 Personal history of diseases of the skin and subcutaneous tissue: Secondary | ICD-10-CM | POA: Diagnosis not present

## 2015-10-08 LAB — COMPREHENSIVE METABOLIC PANEL
ALBUMIN: 4.8 g/dL (ref 3.5–5.0)
ALK PHOS: 48 U/L (ref 38–126)
ALT: 31 U/L (ref 17–63)
ANION GAP: 12 (ref 5–15)
AST: 32 U/L (ref 15–41)
BILIRUBIN TOTAL: 0.8 mg/dL (ref 0.3–1.2)
BUN: 10 mg/dL (ref 6–20)
CALCIUM: 9.8 mg/dL (ref 8.9–10.3)
CO2: 23 mmol/L (ref 22–32)
Chloride: 104 mmol/L (ref 101–111)
Creatinine, Ser: 0.9 mg/dL (ref 0.61–1.24)
GFR calc Af Amer: >60 mL/min (ref >60)
GLUCOSE: 101 mg/dL — AB (ref 65–99)
Potassium: 3.9 mmol/L (ref 3.5–5.1)
Sodium: 139 mmol/L (ref 135–145)
TOTAL PROTEIN: 8.1 g/dL (ref 6.5–8.1)

## 2015-10-08 LAB — CBC
HCT: 44.4 % (ref 39.0–52.0)
HEMOGLOBIN: 14.5 g/dL (ref 13.0–17.0)
MCH: 26.6 pg (ref 26.0–34.0)
MCHC: 32.7 g/dL (ref 30.0–36.0)
MCV: 81.5 fL (ref 78.0–100.0)
Platelets: 205 10*3/uL (ref 150–400)
RBC: 5.45 MIL/uL (ref 4.22–5.81)
RDW: 13.3 % (ref 11.5–15.5)
WBC: 14.7 10*3/uL — AB (ref 4.0–10.5)

## 2015-10-08 LAB — URINALYSIS, ROUTINE W REFLEX MICROSCOPIC
Bilirubin Urine: NEGATIVE
GLUCOSE, UA: NEGATIVE mg/dL
Hgb urine dipstick: NEGATIVE
Ketones, ur: NEGATIVE mg/dL
LEUKOCYTES UA: NEGATIVE
NITRITE: NEGATIVE
PH: 7.5 (ref 5.0–8.0)
Protein, ur: NEGATIVE mg/dL
SPECIFIC GRAVITY, URINE: 1.027 (ref 1.005–1.030)

## 2015-10-08 LAB — LIPASE, BLOOD: Lipase: 20 U/L (ref 11–51)

## 2015-10-08 MED ORDER — ONDANSETRON HCL 4 MG PO TABS: 4.0000 mg | ORAL_TABLET | Freq: Four times a day (QID) | ORAL | Status: DC

## 2015-10-08 MED ORDER — DICYCLOMINE HCL 20 MG PO TABS: 20.0000 mg | ORAL_TABLET | Freq: Two times a day (BID) | ORAL | Status: DC

## 2015-10-08 MED ORDER — ONDANSETRON HCL 4 MG/2ML IJ SOLN
4.0000 mg | Freq: Once | INTRAMUSCULAR | Status: AC
  Administered 2015-10-08: 4 mg via INTRAVENOUS
  Filled 2015-10-08: qty 2

## 2015-10-08 MED ORDER — SODIUM CHLORIDE 0.9 % IV BOLUS (SEPSIS)
1000.0000 mL | Freq: Once | INTRAVENOUS | Status: AC
  Administered 2015-10-08: 1000 mL via INTRAVENOUS

## 2015-10-08 NOTE — ED Provider Notes (Signed)
CSN: [id number]     Arrival date & time 10/08/15  1719 History   First MD Initiated Contact with Patient 10/08/15 2045     Chief Complaint  Patient presents with  . Emesis  . Diarrhea     (Consider location/radiation/quality/duration/timing/severity/associated sxs/prior Treatment) HPI   Patient has hx of HIV (CD4 > 900 on 04/2016) complaint with Stribild. He is here with his Uncle who is aware of his diagnosis. He works at [employer] and attends college classes. He reports Having diarrhea 3 episodes started around lunchtime today. Is associated with diffuse crampy abdominal pain. He has not had any episodes of nonbloody diarrhea since 5 PM, but he then developed nausea and vomiting is nonbloody and non-bilious vomiting x 5 episodes.  He has not had any further episodes in the past few hours.  He is currently not having any pain. He is concerned about possible dehydration but has no complaints at this time.  PCP: No PCP Per Patient  Gary Pena is a 20 y.o.  male  ROS: The patient denies diaphoresis, fever, headache, weakness (general or focal), confusion, change of vision,  dysphagia, aphagia, shortness of breath, lower extremity swelling, rash, neck pain, chest pain  Past Medical History  Diagnosis Date  . Immune deficiency disorder (HCC)   . HIV (human immunodeficiency virus infection) (HCC)   . Skin abscess     01-25-14  recheck on 01-28-14   History reviewed. No pertinent past surgical history. History reviewed. No pertinent family history. Social History  Substance Use Topics  . Smoking status: Current Some Day Smoker    Types: Cigarettes  . Smokeless tobacco: Never Used     Comment: smoked due to stress   . Alcohol Use: No    Review of Systems  Review of Systems All other systems negative except as documented in the HPI. All pertinent positives and negatives as reviewed in the HPI.   Allergies  Review of patient's allergies indicates no known allergies.  Home  Medications   Prior to Admission medications   Medication Sig Start Date End Date Taking? Authorizing Provider  diphenoxylate-atropine (LOMOTIL) 2.5-0.025 MG tablet Take 2 tablets by mouth 4 (four) times daily as needed for diarrhea or loose stools. 08/04/15  Yes Gilda Creasehristopher J Pollina, MD  ketoconazole (NIZORAL) 2 % cream Apply 1 application topically daily as needed for irritation.    Yes Historical Provider, MD  STRIBILD 150-150-200-300 MG TABS tablet TAKE 1 TABLET BY MOUTH EVERY DAY WITH BREAKFAST 02/03/15  Yes [name], MD  traMADol (ULTRAM) 50 MG tablet Take 1 tablet (50 mg total) by mouth every 6 (six) hours as needed. 08/22/15  Yes Arby BarretteMarcy Pfeiffer, MD  dicyclomine (BENTYL) 20 MG tablet Take 1 tablet (20 mg total) by mouth 2 (two) times daily. 10/08/15   Wentworth Edelen Neva SeatGreene, PA-C  ondansetron (ZOFRAN) 4 MG tablet Take 1 tablet (4 mg total) by mouth every 6 (six) hours. Patient not taking: Reported on 10/08/2015 08/04/15   Gilda Creasehristopher J Pollina, MD  ondansetron (ZOFRAN) 4 MG tablet Take 1 tablet (4 mg total) by mouth every 6 (six) hours. 10/08/15   Baby Stairs Neva SeatGreene, PA-C   BP 133/73 mmHg  Pulse 65  Temp(Src) 97.9 F (36.6 C) (Oral)  Resp 18  SpO2 99% Physical Exam  Constitutional: He appears well-developed and well-nourished. No distress.  HENT:  Head: Normocephalic and atraumatic.  Right Ear: Tympanic membrane and ear canal normal.  Left Ear: Tympanic membrane and ear canal normal.  Nose: Nose normal.  Mouth/Throat: Uvula is midline, oropharynx is clear and moist and mucous membranes are normal.  Eyes: Pupils are equal, round, and reactive to light.  Neck: Normal range of motion. Neck supple.  Cardiovascular: Normal rate and regular rhythm.   Pulmonary/Chest: Effort normal.  Abdominal: Soft. Bowel sounds are normal. He exhibits no distension. There is no hepatosplenomegaly. There is no tenderness. There is no rigidity, no rebound, no guarding and no CVA tenderness.  No signs of abdominal  distention Completely benign abdominal exam  Musculoskeletal:  No LE swelling  Neurological: He is alert.  Acting at baseline  Skin: Skin is warm and dry. No rash noted.  Nursing note and vitals reviewed.   ED Course  Procedures (including critical care time) Labs Review Labs Reviewed  COMPREHENSIVE METABOLIC PANEL - Abnormal; Notable for the following:    Glucose, Bld 101 (*)    All other components within normal limits  CBC - Abnormal; Notable for the following:    WBC 14.7 (*)    All other components within normal limits  LIPASE, BLOOD  URINALYSIS, ROUTINE W REFLEX MICROSCOPIC (NOT AT [facility])    Imaging Review No results found. I have personally reviewed and evaluated these images and lab results as part of my medical decision-making.   EKG Interpretation None      MDM   Final diagnoses:  Gastroenteritis    Patients UA, lupase, CMP are unremarkable. He has a WBC of 14.7. -- he has no complaints currently, no pain on exam, is non toxic, afebrile, soft abdomen and not longer having N/V/D. His CD4 is > 900 at the last check and he is complaint and well appearing. Will rx Zofran and Bentyl   Will recommend he  follow-up with his PCP. Return precautions discussed. Passed fluid and cracker challenge in the ED without difficulty.  Filed Vitals:   10/08/15 1724  BP: 133/73  Pulse: 65  Temp: 97.9 F (36.6 C)  Resp: [address], PA-C 10/08/15 2217  Zadie Rhine, MD 10/08/15 2302

## 2015-10-08 NOTE — Discharge Instructions (Signed)

## 2015-10-08 NOTE — ED Notes (Signed)
Pt reports onset this afternoon of n/v/d.

## 2015-10-14 ENCOUNTER — Encounter: Payer: Self-pay | Admitting: Internal Medicine

## 2015-10-14 ENCOUNTER — Ambulatory Visit (INDEPENDENT_AMBULATORY_CARE_PROVIDER_SITE_OTHER): Payer: Medicaid Other | Admitting: Internal Medicine

## 2015-10-14 VITALS — BP 119/68 | HR 50 | Temp 98.0°F | Wt 215.0 lb

## 2015-10-14 DIAGNOSIS — Z23 Encounter for immunization: Secondary | ICD-10-CM

## 2015-10-14 DIAGNOSIS — B20 Human immunodeficiency virus [HIV] disease: Secondary | ICD-10-CM

## 2015-10-14 MED ORDER — ELVITEG-COBIC-EMTRICIT-TENOFDF 150-150-200-300 MG PO TABS: ORAL_TABLET | ORAL | Status: DC

## 2015-10-14 NOTE — Progress Notes (Signed)
Patient ID: Gary Pena, male   DOB: 07/06/1996, 20 y.o.   MRN: [id number]       Patient ID: Gary Pena, male   DOB: April 08, 1996, 20 y.o.   MRN: [id number]  HPI 940/ VL 39 in Oct 2016, on stribild. He is doing well today but last week, he suffered for 4 days of n/v/d which he attributed to "stomach bug" going around at his workplace. He is now recovered and back to his baseline. No sexual contacts since last visit. No other complaints. He states that he will need refils on hiv med.  Outpatient Encounter Prescriptions as of 10/14/2015  Medication Sig  . diphenoxylate-atropine (LOMOTIL) 2.5-0.025 MG tablet Take 2 tablets by mouth 4 (four) times daily as needed for diarrhea or loose stools.  Marland Kitchen ketoconazole (NIZORAL) 2 % cream Apply 1 application topically daily as needed for irritation.   . ondansetron (ZOFRAN) 4 MG tablet Take 1 tablet (4 mg total) by mouth every 6 (six) hours.  . ondansetron (ZOFRAN) 4 MG tablet Take 1 tablet (4 mg total) by mouth every 6 (six) hours.  . STRIBILD 150-150-200-300 MG TABS tablet TAKE 1 TABLET BY MOUTH EVERY DAY WITH BREAKFAST  . dicyclomine (BENTYL) 20 MG tablet Take 1 tablet (20 mg total) by mouth 2 (two) times daily.  . traMADol (ULTRAM) 50 MG tablet Take 1 tablet (50 mg total) by mouth every 6 (six) hours as needed. (Patient not taking: Reported on 10/14/2015)   No facility-administered encounter medications on file as of 10/14/2015.     Patient Active Problem List   Diagnosis Date Noted  . Human immunodeficiency virus (HIV) disease (HCC) 05/01/2013  . Syphilis 05/01/2013     Health Maintenance Due  Topic Date Due  . TETANUS/TDAP  04/26/2014     Review of Systems Constitutional: Negative for fever, chills, diaphoresis, activity change, appetite change, fatigue and unexpected weight change.  HENT: Negative for congestion, sore throat, rhinorrhea, sneezing, trouble swallowing and sinus pressure.  Eyes: Negative for photophobia and visual disturbance.    Respiratory: Negative for cough, chest tightness, shortness of breath, wheezing and stridor.  Cardiovascular: Negative for chest pain, palpitations and leg swelling.  Gastrointestinal: Negative for nausea, vomiting, abdominal pain, diarrhea, constipation, blood in stool, abdominal distention and anal bleeding.  Genitourinary: Negative for dysuria, hematuria, flank pain and difficulty urinating.  Musculoskeletal: Negative for myalgias, back pain, joint swelling, arthralgias and gait problem.  Skin: Negative for color change, pallor, rash and wound.  Neurological: Negative for dizziness, tremors, weakness and light-headedness.  Hematological: Negative for adenopathy. Does not bruise/bleed easily.  Psychiatric/Behavioral: Negative for behavioral problems, confusion, sleep disturbance, dysphoric mood, decreased concentration and agitation.    Physical Exam   BP 119/68 mmHg  Pulse 50  Temp(Src) 98 F (36.7 C) (Oral)  Wt 215 lb (97.523 kg) Physical Exam  Constitutional: He is oriented to person, place, and time. He appears well-developed and well-nourished. No distress.  HENT:  Mouth/Throat: Oropharynx is clear and moist. No oropharyngeal exudate.  Cardiovascular: Normal rate, regular rhythm and normal heart sounds. Exam reveals no gallop and no friction rub.  No murmur heard.  Pulmonary/Chest: Effort normal and breath sounds normal. No respiratory distress. He has no wheezes.  Abdominal: Soft. Bowel sounds are normal. He exhibits no distension. There is no tenderness.  Lymphadenopathy:  He has no cervical adenopathy.  Neurological: He is alert and oriented to person, place, and time.  Skin: Skin is warm and dry. No rash noted. No erythema.  Psychiatric:  He has a normal mood and affect. His behavior is normal.     Lab Results  Component Value Date   CD4TCELL 40 05/22/2015   Lab Results  Component Value Date   CD4TABS 940 05/22/2015   CD4TABS 1020 11/27/2014   CD4TABS 780  04/23/2014   Lab Results  Component Value Date   HIV1RNAQUANT 39* 05/22/2015   Lab Results  Component Value Date   HEPBSAB POS* 04/19/2013   No results found for: RPR  CBC Lab Results  Component Value Date   WBC 14.7* 10/08/2015   RBC 5.45 10/08/2015   HGB 14.5 10/08/2015   HCT 44.4 10/08/2015   PLT 205 10/08/2015   MCV 81.5 10/08/2015   MCH 26.6 10/08/2015   MCHC 32.7 10/08/2015   RDW 13.3 10/08/2015   LYMPHSABS 1.3 08/04/2015   MONOABS 0.7 08/04/2015   EOSABS 0.1 08/04/2015   BASOSABS 0.0 08/04/2015   BMET Lab Results  Component Value Date   NA 139 10/08/2015   K 3.9 10/08/2015   CL 104 10/08/2015   CO2 23 10/08/2015   GLUCOSE 101* 10/08/2015   BUN 10 10/08/2015   CREATININE 0.90 10/08/2015   CALCIUM 9.8 10/08/2015   GFRNONAA >60 10/08/2015   GFRAA >60 10/08/2015     Assessment and Plan  hiv disease = well controlled at last visit, slightly elevated VL. Will repeat VL at next visit. He needs refills  Health maintenance = continue with 2nd dose of hpv  Viral gastroenteritis = now resolved  Leukocytosis = recent labs reveal that he had leukocytosis in setting of gastroenteritis which would be expected. Will repeat cbc at next lab draw prior to next visit, anticipate that it will be WNL.   rtc in 3-694months

## 2015-11-04 ENCOUNTER — Emergency Department (HOSPITAL_COMMUNITY)
Admission: EM | Admit: 2015-11-04 | Discharge: 2015-11-04 | Disposition: A | Discharge status: Home / Self Care | Payer: Medicaid Other | Attending: Emergency Medicine | Admitting: Emergency Medicine

## 2015-11-04 ENCOUNTER — Encounter (HOSPITAL_COMMUNITY): Payer: Self-pay | Admitting: *Deleted

## 2015-11-04 DIAGNOSIS — B20 Human immunodeficiency virus [HIV] disease: Secondary | ICD-10-CM | POA: Insufficient documentation

## 2015-11-04 DIAGNOSIS — F17210 Nicotine dependence, cigarettes, uncomplicated: Secondary | ICD-10-CM | POA: Insufficient documentation

## 2015-11-04 DIAGNOSIS — R6883 Chills (without fever): Secondary | ICD-10-CM | POA: Insufficient documentation

## 2015-11-04 DIAGNOSIS — R1110 Vomiting, unspecified: Secondary | ICD-10-CM | POA: Insufficient documentation

## 2015-11-04 DIAGNOSIS — Z79899 Other long term (current) drug therapy: Secondary | ICD-10-CM | POA: Insufficient documentation

## 2015-11-04 DIAGNOSIS — R6889 Other general symptoms and signs: Secondary | ICD-10-CM

## 2015-11-04 DIAGNOSIS — R197 Diarrhea, unspecified: Secondary | ICD-10-CM | POA: Insufficient documentation

## 2015-11-04 LAB — CBC
HCT: 42.6 % (ref 39.0–52.0)
HEMOGLOBIN: 13.6 g/dL (ref 13.0–17.0)
MCH: 26 pg (ref 26.0–34.0)
MCHC: 31.9 g/dL (ref 30.0–36.0)
MCV: 81.5 fL (ref 78.0–100.0)
Platelets: 182 10*3/uL (ref 150–400)
RBC: 5.23 MIL/uL (ref 4.22–5.81)
RDW: 13.3 % (ref 11.5–15.5)
WBC: 7.7 10*3/uL (ref 4.0–10.5)

## 2015-11-04 LAB — COMPREHENSIVE METABOLIC PANEL
ALBUMIN: 4.2 g/dL (ref 3.5–5.0)
ALK PHOS: 37 U/L — AB (ref 38–126)
ALT: 18 U/L (ref 17–63)
ANION GAP: 8 (ref 5–15)
AST: 25 U/L (ref 15–41)
BUN: 14 mg/dL (ref 6–20)
CALCIUM: 9.4 mg/dL (ref 8.9–10.3)
CO2: 25 mmol/L (ref 22–32)
CREATININE: 0.98 mg/dL (ref 0.61–1.24)
Chloride: 107 mmol/L (ref 101–111)
GFR calc Af Amer: >60 mL/min (ref >60)
GFR calc non Af Amer: >60 mL/min (ref >60)
GLUCOSE: 96 mg/dL (ref 65–99)
Potassium: 4.5 mmol/L (ref 3.5–5.1)
SODIUM: 140 mmol/L (ref 135–145)
Total Bilirubin: 0.8 mg/dL (ref 0.3–1.2)
Total Protein: 7 g/dL (ref 6.5–8.1)

## 2015-11-04 LAB — LIPASE, BLOOD: Lipase: 25 U/L (ref 11–51)

## 2015-11-04 MED ORDER — KETOROLAC TROMETHAMINE 15 MG/ML IJ SOLN
15.0000 mg | Freq: Once | INTRAMUSCULAR | Status: AC
  Administered 2015-11-04: 15 mg via INTRAVENOUS
  Filled 2015-11-04: qty 1

## 2015-11-04 MED ORDER — OSELTAMIVIR PHOSPHATE 75 MG PO CAPS: 75.0000 mg | ORAL_CAPSULE | Freq: Two times a day (BID) | ORAL | Status: DC

## 2015-11-04 MED ORDER — ONDANSETRON HCL 4 MG/2ML IJ SOLN
4.0000 mg | Freq: Once | INTRAMUSCULAR | Status: AC
  Administered 2015-11-04: 4 mg via INTRAVENOUS
  Filled 2015-11-04: qty 2

## 2015-11-04 MED ORDER — ONDANSETRON HCL 4 MG PO TABS: 4.0000 mg | ORAL_TABLET | Freq: Four times a day (QID) | ORAL | Status: DC

## 2015-11-04 MED ORDER — SODIUM CHLORIDE 0.9 % IV BOLUS (SEPSIS)
1000.0000 mL | Freq: Once | INTRAVENOUS | Status: AC
  Administered 2015-11-04: 1000 mL via INTRAVENOUS

## 2015-11-04 NOTE — ED Provider Notes (Signed)
CSN: [id number]     Arrival date & time 11/04/15  4098 History   First MD Initiated Contact with Patient 11/04/15 [phone]     Chief Complaint  Patient presents with  . Diarrhea  . Emesis  . Chills   HPI   Gary Pena is a 20 y.o. male PMH significant for HIV (last CD4 in October 2016 was 900) presenting with a 2 day history of body aches, chills, diarrhea, emesis. He describes his diarrhea as nonbloody nonbilious. He denies recent antibiotic use, ill contacts, recorded fevers, chest pain, shortness of breath, cough, abdominal pain. He states he feels like he has the flu.  Past Medical History  Diagnosis Date  . Immune deficiency disorder (HCC)   . HIV (human immunodeficiency virus infection) (HCC)   . Skin abscess     01-25-14  recheck on 01-28-14   History reviewed. No pertinent past surgical history. No family history on file. Social History  Substance Use Topics  . Smoking status: Current Some Day Smoker    Types: Cigarettes  . Smokeless tobacco: Never Used     Comment: smoked due to stress   . Alcohol Use: No    Review of Systems  Ten systems are reviewed and are negative for acute change except as noted in the HPI  Allergies  Review of patient's allergies indicates no known allergies.  Home Medications   Prior to Admission medications   Medication Sig Start Date End Date Taking? Authorizing Provider  elvitegravir-cobicistat-emtricitabine-tenofovir (STRIBILD) 150-150-200-300 MG TABS tablet TAKE 1 TABLET BY MOUTH EVERY DAY WITH BREAKFAST 10/14/15  Yes Judyann Munson, MD  hydrocortisone cream 1 % Apply 1 application topically 2 (two) times daily.   Yes Historical Provider, MD  dicyclomine (BENTYL) 20 MG tablet Take 1 tablet (20 mg total) by mouth 2 (two) times daily. Patient not taking: Reported on 11/04/2015 10/08/15   Marlon Pel, PA-C  diphenoxylate-atropine (LOMOTIL) 2.5-0.025 MG tablet Take 2 tablets by mouth 4 (four) times daily as needed for diarrhea or loose  stools. Patient not taking: Reported on 11/04/2015 08/04/15   Gilda Crease, MD  ketoconazole (NIZORAL) 2 % cream Apply 1 application topically daily as needed for irritation.     Historical Provider, MD  ondansetron (ZOFRAN) 4 MG tablet Take 1 tablet (4 mg total) by mouth every 6 (six) hours. 11/04/15   Melton Krebs, PA-C  oseltamivir (TAMIFLU) 75 MG capsule Take 1 capsule (75 mg total) by mouth every 12 (twelve) hours. 11/04/15   Melton Krebs, PA-C  traMADol (ULTRAM) 50 MG tablet Take 1 tablet (50 mg total) by mouth every 6 (six) hours as needed. Patient not taking: Reported on 10/14/2015 08/22/15   Arby Barrette, MD   BP 139/81 mmHg  Pulse 50  Temp(Src) 98.5 F (36.9 C) (Oral)  Resp 16  SpO2 100% Physical Exam  Constitutional: He appears well-developed and well-nourished. No distress.  HENT:  Head: Normocephalic and atraumatic.  Right Ear: External ear normal.  Left Ear: External ear normal.  Nose: Nose normal.  Mouth/Throat: Oropharynx is clear and moist. No oropharyngeal exudate.  Bilateral tympanic membranes with clear effusions behind middle ear. No bulging, erythema, drainage.  Eyes: Conjunctivae are normal. Right eye exhibits no discharge. Left eye exhibits no discharge. No scleral icterus.  Neck: No tracheal deviation present.  Cardiovascular: Normal rate, regular rhythm, normal heart sounds and intact distal pulses.  Exam reveals no gallop and no friction rub.   No murmur heard. Pulmonary/Chest: Effort normal and  breath sounds normal. No respiratory distress. He has no wheezes. He has no rales. He exhibits no tenderness.  Abdominal: Soft. Bowel sounds are normal. He exhibits no distension and no mass. There is no tenderness. There is no rebound and no guarding.  Musculoskeletal: He exhibits no edema.  Lymphadenopathy:    He has no cervical adenopathy.  Neurological: He is alert. Coordination normal.  Skin: Skin is warm and dry. No rash noted. He is not  diaphoretic. No erythema.  Psychiatric: He has a normal mood and affect. His behavior is normal.  Nursing note and vitals reviewed.   ED Course  Procedures  Labs Review Labs Reviewed  COMPREHENSIVE METABOLIC PANEL - Abnormal; Notable for the following:    Alkaline Phosphatase 37 (*)    All other components within normal limits  LIPASE, BLOOD  CBC   MDM   Final diagnoses:  Influenza-like symptoms   Patient nontoxic-appearing, vital signs stable. Patient without cough, hypoxia, fever-Will hold off on imaging at this time. Abdomen is nontender. Patient has not had any episodes of diarrhea in the ED. Patients symptoms are consistent with URI, likely viral etiology. Discussed that antibiotics are not indicated for viral infections. Pt will be discharged with symptomatic treatment.  Encouraged patient to follow-up with infectious disease. Patient provided a cup to collect stool sample any event his episodes of diarrhea increase. Discussed return precautions. Since in understanding and agreement with the plan. Patient may be safely discharged at this time.   Melton KrebsSamantha Nicole Garen Woolbright, PA-C 11/04/15 1042  Derwood KaplanAnkit Nanavati, MD 11/05/15 [phone]

## 2015-11-04 NOTE — ED Notes (Signed)
Pt verbalized understanding of d/c instructions, prescriptions, and follow-up care. No further questions/concerns, VSS, ambulatory w/ steady gait (refused wheelchair) 

## 2015-11-04 NOTE — Discharge Instructions (Signed)
Mr. Gean Birchwoodarahje Bennetts,  Nice meeting you! Please follow-up with infectious disease. Return to the emergency department if you develop chest pain, shortness of breath, increased fevers, worsening diarrhea, inability to keep foods down. Feel better soon!  S. Lane HackerNicole Jakira Mcfadden, PA-C

## 2015-11-04 NOTE — ED Notes (Signed)
PT states vomiting, chills, diarrhea, and thinks dehydrated.  Pt states that symptoms started on Sunday.

## 2015-12-26 ENCOUNTER — Telehealth: Payer: Self-pay | Admitting: *Deleted

## 2015-12-26 NOTE — Telephone Encounter (Signed)
PA needed for Stribild, completed and faxed to Medicaid.

## 2016-01-06 ENCOUNTER — Other Ambulatory Visit: Payer: Self-pay | Admitting: *Deleted

## 2016-01-06 DIAGNOSIS — B20 Human immunodeficiency virus [HIV] disease: Secondary | ICD-10-CM

## 2016-01-06 MED ORDER — ELVITEG-COBIC-EMTRICIT-TENOFDF 150-150-200-300 MG PO TABS: ORAL_TABLET | ORAL | Status: DC

## 2016-01-06 NOTE — Telephone Encounter (Signed)
[facility] Application. 

## 2016-01-09 NOTE — Telephone Encounter (Signed)
PA pending.  [facility] to work on this and call [facility] with information as it is available.

## 2016-01-12 NOTE — Telephone Encounter (Signed)
RN refaxed PA to Medicaid.

## 2016-03-17 NOTE — Telephone Encounter (Signed)
Needs ADAP/RW, Medicaid not active.  Will see ADAP/RW prior to lab visit.  [facility] medication since Dec. 2016.

## 2016-03-22 ENCOUNTER — Ambulatory Visit: Payer: Medicaid Other

## 2016-03-22 ENCOUNTER — Other Ambulatory Visit: Payer: Medicaid Other

## 2016-03-24 ENCOUNTER — Ambulatory Visit: Payer: Medicaid Other

## 2016-03-24 ENCOUNTER — Other Ambulatory Visit: Payer: Medicaid Other

## 2016-03-24 DIAGNOSIS — B20 Human immunodeficiency virus [HIV] disease: Secondary | ICD-10-CM

## 2016-03-24 LAB — LIPID PANEL
CHOLESTEROL: 140 mg/dL (ref 125–170)
HDL: 50 mg/dL (ref >=40)
LDL Cholesterol: 83 mg/dL (ref <110)
TRIGLYCERIDES: 37 mg/dL (ref <150)
Total CHOL/HDL Ratio: 2.8 Ratio (ref <=5.0)
VLDL: 7 mg/dL (ref <30)

## 2016-03-24 LAB — CBC WITH DIFFERENTIAL/PLATELET
BASOS ABS: 58 {cells}/uL (ref 0–200)
Basophils Relative: 1 %
EOS ABS: 116 {cells}/uL (ref 15–500)
Eosinophils Relative: 2 %
HEMATOCRIT: 43.6 % (ref 38.5–50.0)
Hemoglobin: 14.1 g/dL (ref 13.2–17.1)
LYMPHS PCT: 35 %
Lymphs Abs: 2030 cells/uL (ref 850–3900)
MCH: 26.5 pg — AB (ref 27.0–33.0)
MCHC: 32.3 g/dL (ref 32.0–36.0)
MCV: 82 fL (ref 80.0–100.0)
MONOS PCT: 8 %
MPV: 10.5 fL (ref 7.5–12.5)
Monocytes Absolute: 464 cells/uL (ref 200–950)
Neutro Abs: 3132 cells/uL (ref 1500–7800)
Neutrophils Relative %: 54 %
Platelets: 203 10*3/uL (ref 140–400)
RBC: 5.32 MIL/uL (ref 4.20–5.80)
RDW: 13.6 % (ref 11.0–15.0)
WBC: 5.8 10*3/uL (ref 3.8–10.8)

## 2016-03-24 LAB — BASIC METABOLIC PANEL
BUN: 15 mg/dL (ref 7–25)
CHLORIDE: 104 mmol/L (ref 98–110)
CO2: 26 mmol/L (ref 20–31)
CREATININE: 1.02 mg/dL (ref 0.60–1.35)
Calcium: 9.8 mg/dL (ref 8.6–10.3)
GLUCOSE: 83 mg/dL (ref 65–99)
Potassium: 4.4 mmol/L (ref 3.5–5.3)
Sodium: 136 mmol/L (ref 135–146)

## 2016-03-25 LAB — T-HELPER CELL (CD4) - (RCID CLINIC ONLY)
CD4 T CELL HELPER: 33 % (ref 33–55)
CD4 T Cell Abs: 650 /uL (ref 400–2700)

## 2016-03-25 LAB — RPR

## 2016-03-26 LAB — HIV-1 RNA QUANT-NO REFLEX-BLD

## 2016-04-15 ENCOUNTER — Ambulatory Visit (INDEPENDENT_AMBULATORY_CARE_PROVIDER_SITE_OTHER): Payer: BLUE CROSS/BLUE SHIELD | Admitting: Internal Medicine

## 2016-04-15 ENCOUNTER — Encounter: Payer: Self-pay | Admitting: Internal Medicine

## 2016-04-15 VITALS — BP 112/69 | HR 70 | Temp 98.1°F | Ht 72.0 in | Wt 200.5 lb

## 2016-04-15 DIAGNOSIS — B20 Human immunodeficiency virus [HIV] disease: Secondary | ICD-10-CM | POA: Diagnosis not present

## 2016-04-15 DIAGNOSIS — Z23 Encounter for immunization: Secondary | ICD-10-CM | POA: Diagnosis not present

## 2016-04-15 MED ORDER — EMTRICITABINE-TENOFOVIR AF 200-25 MG PO TABS: 1.0000 | ORAL_TABLET | Freq: Every day | ORAL | 11 refills | Status: AC

## 2016-04-15 MED ORDER — DOLUTEGRAVIR SODIUM 50 MG PO TABS: 50.0000 mg | ORAL_TABLET | Freq: Every day | ORAL | 11 refills | Status: AC

## 2016-04-15 MED ORDER — ONDANSETRON HCL 4 MG PO TABS: 4.0000 mg | ORAL_TABLET | Freq: Three times a day (TID) | ORAL | 4 refills | Status: DC | PRN

## 2016-04-15 NOTE — Progress Notes (Signed)
RFV: hiv visit Patient ID: Gary Pena, male   DOB: [birth year], 20 y.o.   MRN: [id number]  HPI Gary Pena is a 20yo AAM with HIV disease, Cd 4 count of 650/VL<20 on stribild. He reports anywhere from 2-5 doses per week, essentially Missing too many doses. He is taking his meds 3-5 times per week. He states that he is unable to eat with his medications so he skips his meds. Sometimes, no access to food between 2 jobs plus school.  Outpatient Encounter Prescriptions as of 04/15/2016  Medication Sig  . dicyclomine (BENTYL) 20 MG tablet Take 1 tablet (20 mg total) by mouth 2 (two) times daily. (Patient not taking: Reported on 11/04/2015)  . diphenoxylate-atropine (LOMOTIL) 2.5-0.025 MG tablet Take 2 tablets by mouth 4 (four) times daily as needed for diarrhea or loose stools. (Patient not taking: Reported on 11/04/2015)  . elvitegravir-cobicistat-emtricitabine-tenofovir (STRIBILD) 150-150-200-300 MG TABS tablet TAKE 1 TABLET BY MOUTH EVERY DAY WITH BREAKFAST  . hydrocortisone cream 1 % Apply 1 application topically 2 (two) times daily.  Marland Kitchen ketoconazole (NIZORAL) 2 % cream Apply 1 application topically daily as needed for irritation.   . ondansetron (ZOFRAN) 4 MG tablet Take 1 tablet (4 mg total) by mouth every 6 (six) hours.  Marland Kitchen oseltamivir (TAMIFLU) 75 MG capsule Take 1 capsule (75 mg total) by mouth every 12 (twelve) hours.  . traMADol (ULTRAM) 50 MG tablet Take 1 tablet (50 mg total) by mouth every 6 (six) hours as needed. (Patient not taking: Reported on 10/14/2015)   No facility-administered encounter medications on file as of 04/15/2016.      Patient Active Problem List   Diagnosis Date Noted  . Human immunodeficiency virus (HIV) disease (HCC) 05/01/2013  . Syphilis 05/01/2013     Health Maintenance Due  Topic Date Due  . TETANUS/TDAP  04/26/2014  . INFLUENZA VACCINE  02/24/2016     Review of Systems Review of Systems  Constitutional: Negative for fever, chills, diaphoresis,  activity change, appetite change, fatigue and unexpected weight change.  HENT: Negative for congestion, sore throat, rhinorrhea, sneezing, trouble swallowing and sinus pressure.  Eyes: Negative for photophobia and visual disturbance.  Respiratory: Negative for cough, chest tightness, shortness of breath, wheezing and stridor.  Cardiovascular: Negative for chest pain, palpitations and leg swelling.  Gastrointestinal: Negative for nausea, vomiting, abdominal pain, diarrhea, constipation, blood in stool, abdominal distention and anal bleeding.  Genitourinary: Negative for dysuria, hematuria, flank pain and difficulty urinating.  Musculoskeletal: Negative for myalgias, back pain, joint swelling, arthralgias and gait problem.  Skin: Negative for color change, pallor, rash and wound.  Neurological: Negative for dizziness, tremors, weakness and light-headedness.  Hematological: Negative for adenopathy. Does not bruise/bleed easily.  Psychiatric/Behavioral: Negative for behavioral problems, confusion, sleep disturbance, dysphoric mood, decreased concentration and agitation.    Physical Exam   BP 112/69   Pulse 70   Temp 98.1 F (36.7 C) (Oral)   Ht 6' (1.829 m)   Wt 90.9 kg (200 lb 8 oz)   BMI 27.19 kg/m  Physical Exam  Constitutional: He is oriented to person, place, and time. He appears well-developed and well-nourished. No distress.  HENT:  Mouth/Throat: Oropharynx is clear and moist. No oropharyngeal exudate.  Cardiovascular: Normal rate, regular rhythm and normal heart sounds. Exam reveals no gallop and no friction rub.  No murmur heard.  Pulmonary/Chest: Effort normal and breath sounds normal. No respiratory distress. He has no wheezes.  Abdominal: Soft. Bowel sounds are normal. He exhibits no distension. There  is no tenderness.  Lymphadenopathy:  He has no cervical adenopathy.  Neurological: He is alert and oriented to person, place, and time.  Skin: Skin is warm and dry. No rash  noted. No erythema.  Psychiatric: He has a normal mood and affect. His behavior is normal.    Lab Results  Component Value Date   CD4TCELL 33 03/24/2016   Lab Results  Component Value Date   CD4TABS 650 03/24/2016   CD4TABS 940 05/22/2015   CD4TABS 1,020 11/27/2014   Lab Results  Component Value Date   HIV1RNAQUANT <20 03/24/2016   Lab Results  Component Value Date   HEPBSAB POS (A) 04/19/2013   No results found for: RPR  CBC Lab Results  Component Value Date   WBC 5.8 03/24/2016   RBC 5.32 03/24/2016   HGB 14.1 03/24/2016   HCT 43.6 03/24/2016   PLT 203 03/24/2016   MCV 82.0 03/24/2016   MCH 26.5 (L) 03/24/2016   MCHC 32.3 03/24/2016   RDW 13.6 03/24/2016   LYMPHSABS 2,030 03/24/2016   MONOABS 464 03/24/2016   EOSABS 116 03/24/2016   BASOSABS 58 03/24/2016   BMET Lab Results  Component Value Date   NA 136 03/24/2016   K 4.4 03/24/2016   CL 104 03/24/2016   CO2 26 03/24/2016   GLUCOSE 83 03/24/2016   BUN 15 03/24/2016   CREATININE 1.02 03/24/2016   CALCIUM 9.8 03/24/2016   GFRNONAA >60 11/04/2015   GFRAA >60 11/04/2015     Assessment and Plan  hiv disease =remains well controlled viral load, though cd 4 count trends down in the last 6months. Will continue to monitor with blood work at next visit. Will change him to descovy and tivicay where he doesn't have to take with food  Adherence = spent 20 min with patient doing one on one adherence counseling  Needs to see pharmacy in 2 wk  Is following up with manuel through cell phone  Health maintenance = will give flu vac at this visit

## 2016-06-08 ENCOUNTER — Ambulatory Visit: Payer: BLUE CROSS/BLUE SHIELD | Admitting: Internal Medicine

## 2016-08-20 ENCOUNTER — Encounter (HOSPITAL_COMMUNITY): Payer: Self-pay | Admitting: Emergency Medicine

## 2016-08-20 ENCOUNTER — Emergency Department (HOSPITAL_COMMUNITY)
Admission: EM | Admit: 2016-08-20 | Discharge: 2016-08-20 | Disposition: A | Discharge status: Home / Self Care | Payer: BLUE CROSS/BLUE SHIELD | Attending: Emergency Medicine | Admitting: Emergency Medicine

## 2016-08-20 DIAGNOSIS — J111 Influenza due to unidentified influenza virus with other respiratory manifestations: Secondary | ICD-10-CM | POA: Insufficient documentation

## 2016-08-20 DIAGNOSIS — R05 Cough: Secondary | ICD-10-CM | POA: Diagnosis present

## 2016-08-20 DIAGNOSIS — F17210 Nicotine dependence, cigarettes, uncomplicated: Secondary | ICD-10-CM | POA: Diagnosis not present

## 2016-08-20 DIAGNOSIS — R059 Cough, unspecified: Secondary | ICD-10-CM

## 2016-08-20 DIAGNOSIS — R69 Illness, unspecified: Secondary | ICD-10-CM

## 2016-08-20 LAB — INFLUENZA PANEL BY PCR (TYPE A & B)
Influenza A By PCR: NEGATIVE
Influenza B By PCR: NEGATIVE

## 2016-08-20 LAB — MONONUCLEOSIS SCREEN: Mono Screen: POSITIVE — AB

## 2016-08-20 MED ORDER — BENZONATATE 100 MG PO CAPS: 100.0000 mg | ORAL_CAPSULE | Freq: Three times a day (TID) | ORAL | 0 refills | Status: DC | PRN

## 2016-08-20 MED ORDER — FLUTICASONE PROPIONATE 50 MCG/ACT NA SUSP: 2.0000 | Freq: Every day | NASAL | 0 refills | Status: AC

## 2016-08-20 MED ORDER — HYDROCODONE-HOMATROPINE 5-1.5 MG/5ML PO SYRP: 5.0000 mL | ORAL_SOLUTION | Freq: Four times a day (QID) | ORAL | 0 refills | Status: DC | PRN

## 2016-08-20 MED ORDER — GUAIFENESIN 100 MG/5ML PO LIQD: 100.0000 mg | ORAL | 0 refills | Status: DC | PRN

## 2016-08-20 NOTE — ED Triage Notes (Addendum)
Pt states he has been having flu like symptoms since Tues. Pt has had cough, fever, body aches, nausea. Pt also reports rib pain when coughing

## 2016-08-20 NOTE — Discharge Instructions (Signed)
Your influenza test was negative today.  Your mono test was positive today.  Please read the attached information on "infectious mononucleosis".  Please be cautious when playing contact sports as there is a risk of spleen rupture with mono.  This infection can be spread via oral secretions and sexual intercourse, please be cautious.  Your symptoms should improve in the next 20-30 days.   You have been prescribed Flonase for nasal congestion, Robitussin for phlegm, Tessalon Perles for day time cough and Hycodan syrup for night time cough and chest wall pain due to coughing. Please make sure you get plenty of rest and drink plenty of fluids. Return to the emergency department if your symptoms worsen or do not improve.

## 2016-08-20 NOTE — ED Provider Notes (Addendum)
[facility]-EMERGENCY DEPT Provider Note  CSN: [id number] Arrival date & time: 08/20/16  0715  History   Chief Complaint Chief Complaint  Patient presents with  . Cough  . Fever    HPI Gary Pena is a 21 y.o. male with pmh of HIV+ (CD4 650 and VL <20 on August 2017), syphilis and seasonal allergies presents to the emergency department with cough, sore throat, nasal congestion, night sweats and chest wall pain secondary to cough 4 days. Patient also reports nausea and vomiting for one day, this has resolved. Patient able to tolerate fluids and food without nausea and vomiting now. Pt denies fevers, recent weight loss, shortness of breath, exertional chest pain or SOB, abdominal pain, urinary symptoms or new rashes.    Of note pt last seen by ID doctor (Dr. Drue SecondSnider 04/15/2016) who acknowledged Cd4 trending down, switched him to descovy and tivicay to improve med adherence. Pt states he has been adherent to his medications.   HPI  Past Medical History:  Diagnosis Date  . HIV (human immunodeficiency virus infection) (HCC)   . Immune deficiency disorder (HCC)   . Skin abscess    01-25-14  recheck on 01-28-14    Patient Active Problem List   Diagnosis Date Noted  . Human immunodeficiency virus (HIV) disease 05/01/2013  . Syphilis 05/01/2013    History reviewed. No pertinent surgical history.     Home Medications    Prior to Admission medications   Medication Sig Start Date End Date Taking? Authorizing Provider  benzonatate (TESSALON PERLES) 100 MG capsule Take 1 capsule (100 mg total) by mouth 3 (three) times daily as needed for cough. TAKE FOR DAY TIME COUGH 08/20/16   Liberty Handylaudia J Ronna Herskowitz, PA-C  dolutegravir (TIVICAY) 50 MG tablet Take 1 tablet (50 mg total) by mouth daily. 04/15/16   [name], MD  emtricitabine-tenofovir AF (DESCOVY) 200-25 MG tablet Take 1 tablet by mouth daily. 04/15/16   [name], MD  fluticasone (FLONASE) 50 MCG/ACT nasal spray Place 2 sprays into both  nostrils daily. 08/20/16   Liberty Handylaudia J Kesean Serviss, PA-C  guaiFENesin (ROBITUSSIN) 100 MG/5ML liquid Take 5-10 mLs (100-200 mg total) by mouth every 4 (four) hours as needed for cough. 08/20/16   Liberty Handylaudia J Charish Schroepfer, PA-C  HYDROcodone-homatropine (HYCODAN) 5-1.5 MG/5ML syrup Take 5 mLs by mouth every 6 (six) hours as needed for cough. TAKE FOR NIGHT TIME COUGH AND CHEST WALL PAIN DUE TO COUGH 08/20/16   Liberty Handylaudia J Deloras Reichard, PA-C  hydrocortisone cream 1 % Apply 1 application topically 2 (two) times daily.    Historical Provider, MD  ondansetron (ZOFRAN) 4 MG tablet Take 1 tablet (4 mg total) by mouth every 8 (eight) hours as needed for nausea or vomiting. 04/15/16   [name], MD    Family History History reviewed. No pertinent family history.  Social History Social History  Substance Use Topics  . Smoking status: Current Some Day Smoker    Packs/day: 1.00    Years: 2.00    Types: Cigarettes  . Smokeless tobacco: Never Used     Comment: smoked due to stress   . Alcohol use No     Allergies   Patient has no known allergies.   Review of Systems Review of Systems  Constitutional: Positive for chills. Negative for fever.  HENT: Positive for congestion and sore throat.   Eyes: Negative for visual disturbance.  Respiratory: Positive for cough. Negative for chest tightness and shortness of breath.   Cardiovascular: Negative for chest pain.  Gastrointestinal: Positive for nausea and vomiting. Negative for abdominal pain, constipation and diarrhea.  Genitourinary: Negative for decreased urine volume and difficulty urinating.  Musculoskeletal: Negative for arthralgias and joint swelling.  Skin: Negative for rash.  Neurological: Negative for dizziness, light-headedness and headaches.     Physical Exam Updated Vital Signs BP 140/77 (BP Location: Right Arm)   Pulse 67   Temp 98.5 F (36.9 C) (Oral)   Resp 15   Ht 5\' 11"  (1.803 m)   Wt 86.2 kg   SpO2 100%   BMI 26.50 kg/m   Physical  Exam  Constitutional: He is oriented to person, place, and time. He appears well-developed and well-nourished. No distress.  NAD.  HENT:  Head: Normocephalic and atraumatic.  Right Ear: External ear normal.  Left Ear: External ear normal.  Nose: Nose normal.  Mouth/Throat: Oropharynx is clear and moist. No oropharyngeal exudate.  Moist mucous membranes.  No nasal mucosa edema. Oropharynx and tonsils pink without erythema, edema, exudates or lesions.  Uvula midline. No trismus.   Eyes: Conjunctivae and EOM are normal. Pupils are equal, round, and reactive to light. No scleral icterus.  Neck: Normal range of motion. Neck supple. No JVD present. No tracheal deviation present.  Cardiovascular: Normal rate, regular rhythm, normal heart sounds and intact distal pulses.   No murmur heard. Pulmonary/Chest: Effort normal and breath sounds normal. He has no wheezes.  RR within normal limits. SpO2 within normal limits.  Normal breathing effort. Patient speaking in full sentences. No pursed lip breathing. Chest wall expansion symmetric.  No chest wall tenderness. Lungs CTAB anteriorly and posteriorly without wheezing, rhonchi or rales.  No egophony.   Abdominal: Soft. He exhibits no distension. There is no tenderness.  Musculoskeletal: Normal range of motion. He exhibits no deformity.  Lymphadenopathy:    He has no cervical adenopathy.  Neurological: He is alert and oriented to person, place, and time.  Skin: Skin is warm and dry. Capillary refill takes less than 2 seconds. No rash noted.  Psychiatric: He has a normal mood and affect. His behavior is normal. Judgment and thought content normal.  Nursing note and vitals reviewed.   ED Treatments / Results  Labs (all labs ordered are listed, but only abnormal results are displayed) Labs Reviewed  MONONUCLEOSIS SCREEN - Abnormal; Notable for the following:       Result Value   Mono Screen POSITIVE (*)    All other components within normal  limits  INFLUENZA PANEL BY PCR (TYPE A & B)    EKG  EKG Interpretation None       Radiology No results found.  Procedures Procedures (including critical care time)  Medications Ordered in ED Medications - No data to display   Initial Impression / Assessment and Plan / ED Course  I have reviewed the triage vital signs and the nursing notes.  Pertinent labs & imaging results that were available during my care of the patient were reviewed by me and considered in my medical decision making (see chart for details).  Clinical Course as of Aug 20 1024  Fri Aug 20, 2016  1025 +mono Mono Screen: (!) POSITIVE [CG]    Clinical Course User Index [CG] Liberty Handy, [place]    22 year old male with pertinent past medical history of HIV+ (CD4 650 and VL <20 on August 2017), syphilis and seasonal allergies presents to the emergency department with upper respiratory infection symptoms  4 days. Negative strep.  Mono positive today most likely cause  of nasal congestion, sore throat, cough and chest wall pain.  On my exam patient is nontoxic appearing, speaking in full sentences. No fever, no tachypnea, no tachycardia, normal oxygen saturations. Lungs are clear to auscultation bilaterally without wheezing, rales, egophony. I do not think that a chest x-ray is indicated at this time as vital signs are within normal limits, there are no signs of consolidation on chest exam, there is no hypoxia. PERC negative.  I doubt bacterial bronchitis, pneumonia, PE, ACS. No oropharyngeal edema compromising airway.  Symptoms will be treated conservatively at this point.   Given reassuring physical exam and vital signs within normal limits patient will be discharged with symptomatic treatment including Flonase, Tessalon, Robitussin, Hycodan, rest and fluids. Strict ED return precautions given. Patient is aware to be cautious with contact sports and return to play 21 days after asymptoamtic. Patient is aware of  red flag symptoms to monitor for that would warrant return to the emergency department for further reevaluation. Pt ambulated with pulse ox within normal limits prior to discharge.    Final Clinical Impressions(s) / ED Diagnoses   Final diagnoses:  Influenza-like illness  Cough    New Prescriptions New Prescriptions   BENZONATATE (TESSALON PERLES) 100 MG CAPSULE    Take 1 capsule (100 mg total) by mouth 3 (three) times daily as needed for cough. TAKE FOR DAY TIME COUGH   FLUTICASONE (FLONASE) 50 MCG/ACT NASAL SPRAY    Place 2 sprays into both nostrils daily.   GUAIFENESIN (ROBITUSSIN) 100 MG/5ML LIQUID    Take 5-10 mLs (100-200 mg total) by mouth every 4 (four) hours as needed for cough.   HYDROCODONE-HOMATROPINE (HYCODAN) 5-1.5 MG/5ML SYRUP    Take 5 mLs by mouth every 6 (six) hours as needed for cough. TAKE FOR NIGHT TIME COUGH AND CHEST WALL PAIN DUE TO COUGH     Liberty Handy, PA-C 08/20/16 0950    Melene Plan, DO 08/20/16 4098    Liberty Handy, PA-C 08/20/16 1026    Melene Plan, DO 08/20/16 [phone]

## 2016-09-02 ENCOUNTER — Ambulatory Visit (HOSPITAL_COMMUNITY)
Admission: EM | Admit: 2016-09-02 | Discharge: 2016-09-02 | Disposition: A | Discharge status: Home / Self Care | Payer: BLUE CROSS/BLUE SHIELD | Attending: Emergency Medicine | Admitting: Emergency Medicine

## 2016-09-02 ENCOUNTER — Encounter (HOSPITAL_COMMUNITY): Payer: Self-pay | Admitting: Emergency Medicine

## 2016-09-02 ENCOUNTER — Ambulatory Visit (INDEPENDENT_AMBULATORY_CARE_PROVIDER_SITE_OTHER): Payer: BLUE CROSS/BLUE SHIELD

## 2016-09-02 DIAGNOSIS — S62324A Displaced fracture of shaft of fourth metacarpal bone, right hand, initial encounter for closed fracture: Secondary | ICD-10-CM

## 2016-09-02 DIAGNOSIS — S62309A Unspecified fracture of unspecified metacarpal bone, initial encounter for closed fracture: Secondary | ICD-10-CM

## 2016-09-02 HISTORY — DX: Unspecified fracture of unspecified metacarpal bone, initial encounter for closed fracture: S62.309A

## 2016-09-02 MED ORDER — HYDROCODONE-ACETAMINOPHEN 5-325 MG PO TABS: 2.0000 | ORAL_TABLET | ORAL | 0 refills | Status: AC | PRN

## 2016-09-02 MED ORDER — HYDROCODONE-ACETAMINOPHEN 5-325 MG PO TABS
2.0000 | ORAL_TABLET | Freq: Once | ORAL | Status: AC
  Administered 2016-09-02: 2 via ORAL

## 2016-09-02 MED ORDER — IBUPROFEN 800 MG PO TABS: 800.0000 mg | ORAL_TABLET | Freq: Three times a day (TID) | ORAL | 0 refills | Status: DC | PRN

## 2016-09-02 MED ORDER — HYDROCODONE-ACETAMINOPHEN 5-325 MG PO TABS
ORAL_TABLET | ORAL | Status: AC
  Filled 2016-09-02: qty 2

## 2016-09-02 NOTE — ED Provider Notes (Signed)
HPI  SUBJECTIVE:  Gary Pena is a right handed 21 y.o. male who presents with pain, swelling of his right hand after punching a wall earlier today. He states the pain is dull, becomes sharp with movement of his hand or palpation. It is intermittent, lasting seconds, depending on activity. He reports difficulty moving his little, ring, middle finger secondary to the pain. He tried ice, elevation. Symptoms better with ice, worse with palpation and with moving his fingers. He denies any other injury. He has past medical history of HIV (CD4 650 and viral load <20 in August 2017 per chart review), syphilis and history of right fifth boxer's fracture which was casted. He is a smoker. No history of diabetes or hypertension. PMD: Dr. Wyline Copas    Past Medical History:  Diagnosis Date  . HIV (human immunodeficiency virus infection) (HCC)   . Immune deficiency disorder (HCC)   . Skin abscess    01-25-14  recheck on 01-28-14    History reviewed. No pertinent surgical history.  History reviewed. No pertinent family history.  Social History  Substance Use Topics  . Smoking status: Current Some Day Smoker    Packs/day: 1.00    Years: 2.00    Types: Cigarettes  . Smokeless tobacco: Never Used     Comment: smoked due to stress   . Alcohol use No    No current facility-administered medications for this encounter.   Current Outpatient Prescriptions:  .  dolutegravir (TIVICAY) 50 MG tablet, Take 1 tablet (50 mg total) by mouth daily., Disp: 30 tablet, Rfl: 11 .  emtricitabine-tenofovir AF (DESCOVY) 200-25 MG tablet, Take 1 tablet by mouth daily., Disp: 30 tablet, Rfl: 11 .  benzonatate (TESSALON PERLES) 100 MG capsule, Take 1 capsule (100 mg total) by mouth 3 (three) times daily as needed for cough. TAKE FOR DAY TIME COUGH, Disp: 20 capsule, Rfl: 0 .  fluticasone (FLONASE) 50 MCG/ACT nasal spray, Place 2 sprays into both nostrils daily., Disp: 16 g, Rfl: 0 .  guaiFENesin (ROBITUSSIN) 100 MG/5ML  liquid, Take 5-10 mLs (100-200 mg total) by mouth every 4 (four) hours as needed for cough., Disp: 60 mL, Rfl: 0 .  HYDROcodone-acetaminophen (NORCO/VICODIN) 5-325 MG tablet, Take 2 tablets by mouth every 4 (four) hours as needed for moderate pain., Disp: 20 tablet, Rfl: 0 .  hydrocortisone cream 1 %, Apply 1 application topically 2 (two) times daily., Disp: , Rfl:  .  ibuprofen (ADVIL,MOTRIN) 800 MG tablet, Take 1 tablet (800 mg total) by mouth every 8 (eight) hours as needed., Disp: 30 tablet, Rfl: 0 .  ondansetron (ZOFRAN) 4 MG tablet, Take 1 tablet (4 mg total) by mouth every 8 (eight) hours as needed for nausea or vomiting., Disp: 20 tablet, Rfl: 4  No Known Allergies   ROS  As noted in HPI.   Physical Exam  There were no vitals taken for this visit.  Constitutional: Well developed, well nourished, no acute distress Eyes: PERRL, EOMI, conjunctiva normal bilaterally HENT: Normocephalic, atraumatic,mucus membranes moist Respiratory: Normal inspiratory effort Cardiovascular: Normal rate  GI:  nondistended Back: no CVAT skin: No rash, skin intact Musculoskeletal: R hand: Extensive swelling over the dorsum of his hand especially over the fourth metacarpal, diffuse lateral tenderness especially over the fourth metacarpal. 2 point discrimination intact in the median/radial/ulnar distribution. Patient has very limited range of motion of his little, ring, middle fingers, but he is able to flex and extend them. No rotational deformity of the finger. Palm normal. Cap refill less  than 2 seconds. Wrist normal Neurologic: Alert & oriented x 3, CN II-XII grossly intact, no motor deficits, sensation grossly intact Psychiatric: Speech and behavior appropriate   ED Course   Medications  HYDROcodone-acetaminophen (NORCO/VICODIN) 5-325 MG per tablet 2 tablet (2 tablets Oral Given 09/02/16 1356)    Orders Placed This Encounter  Procedures  . DG Hand Complete Right    Standing Status:   Standing     Number of Occurrences:   1    Order Specific Question:   Reason for Exam (SYMPTOM  OR DIAGNOSIS REQUIRED)    Answer:   swelling, pain.... punched wall ([place] and hit stud)   No results found for this or any previous visit (from the past 24 hour(s)). Dg Hand Complete Right  Result Date: 09/02/2016 CLINICAL DATA:  Punched wall with right hand with pain, initial encounter EXAM: RIGHT HAND - COMPLETE 3+ VIEW COMPARISON:  None. FINDINGS: There is an oblique fracture through the midshaft of the fourth metacarpal with mild angulation at the fracture site. Curvature of the fifth metacarpal is noted likely related to prior fracture with healing. No other focal abnormality is seen. IMPRESSION: Fourth metacarpal fracture. Changes consistent with prior healed fifth metacarpal fracture. Electronically Signed   By: Alcide CleverMark  Lukens M.D.   On: 09/02/2016 12:32    ED Clinical Impression  Closed displaced fracture of shaft of fourth metacarpal bone of right hand, initial encounter   ED Assessment/Plan  Reviewed  imaging independently. Oblique fracture through the midshaft of the fourth metacarpal with mild angulation. Healed prior fifth metacarpal fracture See radiology report for full details.  [facility] [place]. 1 prescription of Norco in October 2017, Hydromet cough syrup 08/20/2016 area no other opiate prescriptions in the past 6 months  1307- Dr. Betha LoaKevin Kuzma on call. discussed case with Angie,  RN. Paging Dr Merlyn LotKuzma [phone]- discussed case with Dr. Merlyn LotKuzma, agrees with short arm ulnar gutter splint with MCP joints flexed at 90, Norco, ibuprofen 800 mg 3 times a day. He is to call the office today or tomorrow and they will arrange follow-up tomorrow or on Monday.  Discussed  imaging, MDM, plan and followup with patient. Discussed sn/sx that should prompt return to the ED. Patient  agrees with plan.   Meds ordered this encounter  Medications  . HYDROcodone-acetaminophen  (NORCO/VICODIN) 5-325 MG per tablet 2 tablet  . ibuprofen (ADVIL,MOTRIN) 800 MG tablet    Sig: Take 1 tablet (800 mg total) by mouth every 8 (eight) hours as needed.    Dispense:  30 tablet    Refill:  0  . HYDROcodone-acetaminophen (NORCO/VICODIN) 5-325 MG tablet    Sig: Take 2 tablets by mouth every 4 (four) hours as needed for moderate pain.    Dispense:  20 tablet    Refill:  0    *This clinic note was created using Scientist, clinical (histocompatibility and immunogenetics)Dragon dictation software. Therefore, there may be occasional mistakes despite careful proofreading.  ?   Domenick GongAshley Rickie Gange, MD 09/03/16 [phone]

## 2016-09-02 NOTE — Discharge Instructions (Signed)
Call Dr. Merrilee SeashoreKuzma's office today to set up an appointment tomorrow or on Monday. Ibuprofen 800 mg 3 times a day. You may take this with the Norco. This is an effective combination for pain. Keep the splint on at all times. Go to the ER if your hand turns blue, white, for pain not controlled with medications, or other concerns.

## 2016-09-02 NOTE — Progress Notes (Signed)
Orthopedic Tech Progress Note Patient Details:  Gary Birchwoodarahje Burciaga 01/03/1996 [id number]  Ortho Devices Type of Ortho Device: Ace wrap, Ulna gutter splint Ortho Device/Splint Location: rue Ortho Device/Splint Interventions: Application   [name] 09/02/2016, 2:54 PM aws ordered by Dr. Chaney MallingMortenson

## 2016-09-02 NOTE — ED Triage Notes (Signed)
Pt reports he punched a wall this am while arguing w/partner  Hx of boxers fracture.  Sx today include: swelling and pain  A&O x4... NAD

## 2016-09-02 NOTE — ED Notes (Signed)
Ortho at bedside.

## 2016-09-06 ENCOUNTER — Other Ambulatory Visit: Payer: Self-pay | Admitting: Orthopedic Surgery

## 2016-09-07 ENCOUNTER — Encounter (HOSPITAL_BASED_OUTPATIENT_CLINIC_OR_DEPARTMENT_OTHER): Payer: Self-pay | Admitting: *Deleted

## 2016-09-09 ENCOUNTER — Ambulatory Visit (HOSPITAL_BASED_OUTPATIENT_CLINIC_OR_DEPARTMENT_OTHER): Payer: BLUE CROSS/BLUE SHIELD | Admitting: Anesthesiology

## 2016-09-09 ENCOUNTER — Encounter (HOSPITAL_BASED_OUTPATIENT_CLINIC_OR_DEPARTMENT_OTHER)
Admission: RE | Disposition: A | Discharge status: Home / Self Care | Payer: Self-pay | Source: Ambulatory Visit | Attending: Orthopedic Surgery

## 2016-09-09 ENCOUNTER — Ambulatory Visit (HOSPITAL_BASED_OUTPATIENT_CLINIC_OR_DEPARTMENT_OTHER)
Admission: RE | Admit: 2016-09-09 | Discharge: 2016-09-09 | Disposition: A | Discharge status: Home / Self Care | Payer: BLUE CROSS/BLUE SHIELD | Source: Ambulatory Visit | Attending: Orthopedic Surgery | Admitting: Orthopedic Surgery

## 2016-09-09 ENCOUNTER — Encounter (HOSPITAL_BASED_OUTPATIENT_CLINIC_OR_DEPARTMENT_OTHER): Payer: Self-pay | Admitting: Anesthesiology

## 2016-09-09 DIAGNOSIS — W228XXA Striking against or struck by other objects, initial encounter: Secondary | ICD-10-CM | POA: Insufficient documentation

## 2016-09-09 DIAGNOSIS — S62324A Displaced fracture of shaft of fourth metacarpal bone, right hand, initial encounter for closed fracture: Secondary | ICD-10-CM | POA: Diagnosis not present

## 2016-09-09 DIAGNOSIS — B20 Human immunodeficiency virus [HIV] disease: Secondary | ICD-10-CM | POA: Insufficient documentation

## 2016-09-09 DIAGNOSIS — Z87891 Personal history of nicotine dependence: Secondary | ICD-10-CM | POA: Diagnosis not present

## 2016-09-09 DIAGNOSIS — Z7951 Long term (current) use of inhaled steroids: Secondary | ICD-10-CM | POA: Diagnosis not present

## 2016-09-09 DIAGNOSIS — Z79899 Other long term (current) drug therapy: Secondary | ICD-10-CM | POA: Insufficient documentation

## 2016-09-09 HISTORY — DX: Unspecified fracture of unspecified metacarpal bone, initial encounter for closed fracture: S62.309A

## 2016-09-09 HISTORY — PX: OPEN REDUCTION INTERNAL FIXATION (ORIF) METACARPAL: SHX6234

## 2016-09-09 SURGERY — OPEN REDUCTION INTERNAL FIXATION (ORIF) METACARPAL
Anesthesia: General | Site: Hand | Laterality: Right

## 2016-09-09 MED ORDER — MIDAZOLAM HCL 2 MG/2ML IJ SOLN
INTRAMUSCULAR | Status: AC
Start: 2016-09-09 — End: 2016-09-09
  Filled 2016-09-09: qty 2

## 2016-09-09 MED ORDER — MIDAZOLAM HCL 2 MG/2ML IJ SOLN
INTRAMUSCULAR | Status: AC
  Filled 2016-09-09: qty 2

## 2016-09-09 MED ORDER — MIDAZOLAM HCL 2 MG/2ML IJ SOLN
1.0000 mg | INTRAMUSCULAR | Status: DC | PRN
  Administered 2016-09-09 (×2): 2 mg via INTRAVENOUS

## 2016-09-09 MED ORDER — PROPOFOL 10 MG/ML IV BOLUS
INTRAVENOUS | Status: DC | PRN
  Administered 2016-09-09: 250 mg via INTRAVENOUS

## 2016-09-09 MED ORDER — CEFAZOLIN SODIUM-DEXTROSE 2-4 GM/100ML-% IV SOLN
2.0000 g | INTRAVENOUS | Status: AC
  Administered 2016-09-09: 2 g via INTRAVENOUS

## 2016-09-09 MED ORDER — DEXAMETHASONE SODIUM PHOSPHATE 10 MG/ML IJ SOLN
INTRAMUSCULAR | Status: AC
  Filled 2016-09-09: qty 1

## 2016-09-09 MED ORDER — CHLORHEXIDINE GLUCONATE 4 % EX LIQD: 60.0000 mL | Freq: Once | CUTANEOUS | Status: DC

## 2016-09-09 MED ORDER — MEPERIDINE HCL 25 MG/ML IJ SOLN: 6.2500 mg | INTRAMUSCULAR | Status: DC | PRN

## 2016-09-09 MED ORDER — DEXAMETHASONE SODIUM PHOSPHATE 4 MG/ML IJ SOLN
INTRAMUSCULAR | Status: DC | PRN
  Administered 2016-09-09: 10 mg via INTRAVENOUS

## 2016-09-09 MED ORDER — FENTANYL CITRATE (PF) 100 MCG/2ML IJ SOLN
50.0000 ug | INTRAMUSCULAR | Status: DC | PRN
  Administered 2016-09-09: 100 ug via INTRAVENOUS

## 2016-09-09 MED ORDER — HYDROMORPHONE HCL 1 MG/ML IJ SOLN: 0.2500 mg | INTRAMUSCULAR | Status: DC | PRN

## 2016-09-09 MED ORDER — ONDANSETRON HCL 4 MG/2ML IJ SOLN
INTRAMUSCULAR | Status: AC
  Filled 2016-09-09: qty 2

## 2016-09-09 MED ORDER — GLYCOPYRROLATE 0.2 MG/ML IJ SOLN: 0.2000 mg | Freq: Once | INTRAMUSCULAR | Status: DC | PRN

## 2016-09-09 MED ORDER — CEFAZOLIN SODIUM-DEXTROSE 2-4 GM/100ML-% IV SOLN
INTRAVENOUS | Status: AC
  Filled 2016-09-09: qty 100

## 2016-09-09 MED ORDER — LACTATED RINGERS IV SOLN
INTRAVENOUS | Status: DC
  Administered 2016-09-09 (×3): via INTRAVENOUS

## 2016-09-09 MED ORDER — BUPIVACAINE-EPINEPHRINE (PF) 0.5% -1:200000 IJ SOLN
INTRAMUSCULAR | Status: DC | PRN
Start: 2016-09-09 — End: 2016-09-09
  Administered 2016-09-09: 30 mL via PERINEURAL

## 2016-09-09 MED ORDER — ONDANSETRON HCL 4 MG/2ML IJ SOLN: 4.0000 mg | Freq: Once | INTRAMUSCULAR | Status: DC | PRN

## 2016-09-09 MED ORDER — SCOPOLAMINE 1 MG/3DAYS TD PT72: 1.0000 | MEDICATED_PATCH | Freq: Once | TRANSDERMAL | Status: DC | PRN

## 2016-09-09 MED ORDER — HYDROCODONE-ACETAMINOPHEN 7.5-325 MG PO TABS: 1.0000 | ORAL_TABLET | Freq: Once | ORAL | Status: DC | PRN

## 2016-09-09 MED ORDER — FENTANYL CITRATE (PF) 100 MCG/2ML IJ SOLN
INTRAMUSCULAR | Status: AC
  Filled 2016-09-09: qty 2

## 2016-09-09 MED ORDER — ONDANSETRON HCL 4 MG/2ML IJ SOLN
INTRAMUSCULAR | Status: DC | PRN
  Administered 2016-09-09: 4 mg via INTRAVENOUS

## 2016-09-09 MED ORDER — OXYCODONE-ACETAMINOPHEN 5-325 MG PO TABS: ORAL_TABLET | ORAL | 0 refills | Status: AC

## 2016-09-09 MED ORDER — LIDOCAINE 2% (20 MG/ML) 5 ML SYRINGE
INTRAMUSCULAR | Status: DC | PRN
  Administered 2016-09-09: 100 mg via INTRAVENOUS

## 2016-09-09 SURGICAL SUPPLY — 57 items
BANDAGE ACE 3X5.8 VEL STRL LF (GAUZE/BANDAGES/DRESSINGS) ×3 IMPLANT
BIT DRILL 1.1 (BIT) ×1
BIT DRILL 1.1MM (BIT) ×1
BIT DRILL 60X20X1.1XQC TMX (BIT) ×1 IMPLANT
BIT DRL 60X20X1.1XQC TMX (BIT) ×1
BLADE SURG 15 STRL LF DISP TIS (BLADE) ×2 IMPLANT
BLADE SURG 15 STRL SS (BLADE) ×4
BNDG ESMARK 4X9 LF (GAUZE/BANDAGES/DRESSINGS) ×3 IMPLANT
BNDG GAUZE ELAST 4 BULKY (GAUZE/BANDAGES/DRESSINGS) ×3 IMPLANT
CHLORAPREP W/TINT 26ML (MISCELLANEOUS) ×6 IMPLANT
CORDS BIPOLAR (ELECTRODE) ×3 IMPLANT
COVER BACK TABLE 60X90IN (DRAPES) ×3 IMPLANT
COVER MAYO STAND STRL (DRAPES) ×3 IMPLANT
CUFF TOURNIQUET SINGLE 18IN (TOURNIQUET CUFF) ×3 IMPLANT
DRAPE EXTREMITY T 121X128X90 (DRAPE) ×3 IMPLANT
DRAPE OEC MINIVIEW 54X84 (DRAPES) ×3 IMPLANT
DRAPE SURG 17X23 STRL (DRAPES) ×3 IMPLANT
GAUZE SPONGE 4X4 12PLY STRL (GAUZE/BANDAGES/DRESSINGS) ×3 IMPLANT
GAUZE XEROFORM 1X8 LF (GAUZE/BANDAGES/DRESSINGS) ×3 IMPLANT
GLOVE BIO SURGEON STRL SZ 6.5 (GLOVE) ×2 IMPLANT
GLOVE BIO SURGEON STRL SZ7.5 (GLOVE) ×3 IMPLANT
GLOVE BIO SURGEONS STRL SZ 6.5 (GLOVE) ×1
GLOVE BIOGEL PI IND STRL 7.0 (GLOVE) ×1 IMPLANT
GLOVE BIOGEL PI IND STRL 8 (GLOVE) ×2 IMPLANT
GLOVE BIOGEL PI IND STRL 8.5 (GLOVE) ×1 IMPLANT
GLOVE BIOGEL PI INDICATOR 7.0 (GLOVE) ×2
GLOVE BIOGEL PI INDICATOR 8 (GLOVE) ×4
GLOVE BIOGEL PI INDICATOR 8.5 (GLOVE) ×2
GLOVE SURG ORTHO 8.0 STRL STRW (GLOVE) ×3 IMPLANT
GOWN STRL REUS W/ TWL LRG LVL3 (GOWN DISPOSABLE) ×1 IMPLANT
GOWN STRL REUS W/TWL LRG LVL3 (GOWN DISPOSABLE) ×2
GOWN STRL REUS W/TWL XL LVL3 (GOWN DISPOSABLE) ×6 IMPLANT
NEEDLE HYPO 25X1 1.5 SAFETY (NEEDLE) IMPLANT
NS IRRIG 1000ML POUR BTL (IV SOLUTION) ×3 IMPLANT
PACK BASIN DAY SURGERY FS (CUSTOM PROCEDURE TRAY) ×3 IMPLANT
PAD CAST 4YDX4 CTTN HI CHSV (CAST SUPPLIES) ×1 IMPLANT
PADDING CAST COTTON 4X4 STRL (CAST SUPPLIES) ×2
SCREW NL 1.5X13 (Screw) ×9 IMPLANT
SCREW NONIOC 1.5 14M (Screw) ×3 IMPLANT
SLEEVE SCD COMPRESS KNEE MED (MISCELLANEOUS) ×3 IMPLANT
SLING ARM FOAM STRAP LRG (SOFTGOODS) ×3 IMPLANT
SPLINT PLASTER CAST XFAST 3X15 (CAST SUPPLIES) ×20 IMPLANT
SPLINT PLASTER CAST XFAST 4X15 (CAST SUPPLIES) IMPLANT
SPLINT PLASTER XTRA FAST SET 4 (CAST SUPPLIES)
SPLINT PLASTER XTRA FASTSET 3X (CAST SUPPLIES) ×40
STOCKINETTE 4X48 STRL (DRAPES) ×3 IMPLANT
SUT CHROMIC 4 0 PS 2 18 (SUTURE) ×3 IMPLANT
SUT ETHILON 3 0 PS 1 (SUTURE) IMPLANT
SUT ETHILON 4 0 PS 2 18 (SUTURE) ×3 IMPLANT
SUT MERSILENE 4 0 P 3 (SUTURE) IMPLANT
SUT VIC AB 3-0 PS1 18 (SUTURE)
SUT VIC AB 3-0 PS1 18XBRD (SUTURE) IMPLANT
SUT VICRYL 4-0 PS2 18IN ABS (SUTURE) ×3 IMPLANT
SYR BULB 3OZ (MISCELLANEOUS) ×3 IMPLANT
SYR CONTROL 10ML LL (SYRINGE) IMPLANT
TOWEL OR 17X24 6PK STRL BLUE (TOWEL DISPOSABLE) ×6 IMPLANT
UNDERPAD 30X30 (UNDERPADS AND DIAPERS) ×3 IMPLANT

## 2016-09-09 NOTE — Op Note (Signed)
I assisted Surgeon(s) and Role:    * Betha LoaKevin Anuoluwapo Mefferd, MD - Primary    * Cindee SaltGary Hrithik Boschee, MD - Assisting on the Procedure(s): OPEN REDUCTION INTERNAL FIXATION (ORIF) METACARPAL, RIGHT RING on 09/09/2016.  I provided assistance on this case as follows: approach, identification, debridement,reduction,stabilization,application of the fixation device for treatment of the fracture. I helped with closure,application of the dressing and splints. I was present for the entire case.  Electronically signed by: Nicki ReaperKUZMA,Semiyah Newgent R, MD Date: 09/09/2016 Time: 5:35 PM

## 2016-09-09 NOTE — Anesthesia Procedure Notes (Signed)
Procedure Name: LMA Insertion Date/Time: 09/09/2016 4:47 PM Performed by: Gar GibbonKEETON, Shawndell Schillaci S Pre-anesthesia Checklist: Patient identified, Emergency Drugs available, Suction available and Patient being monitored Patient Re-evaluated:Patient Re-evaluated prior to inductionOxygen Delivery Method: Circle system utilized Preoxygenation: Pre-oxygenation with 100% oxygen Intubation Type: IV induction Ventilation: Mask ventilation without difficulty LMA: LMA inserted LMA Size: 5.0 Number of attempts: 1 Airway Equipment and Method: Bite block Placement Confirmation: positive ETCO2 Tube secured with: Tape Dental Injury: Teeth and Oropharynx as per pre-operative assessment

## 2016-09-09 NOTE — H&P (Signed)
  Gary Pena is an 21 y.o. male.   Chief Complaint: right metacarpal fracture HPI: 21 yo male states he injured right hand when he punched a wall.  [facility].  Splinted and followed up in office.  He wishes to proceed with operative fixation.  Allergies: Not on File  Past Medical History:  Diagnosis Date  . HIV (human immunodeficiency virus infection) (HCC)   . Metacarpal bone fracture 09/02/2016   right ring    Past Surgical History:  Procedure Laterality Date  . WISDOM TOOTH EXTRACTION      Family History: History reviewed. No pertinent family history.  Social History:   reports that he quit smoking about 6 weeks ago. He smoked 1.00 pack per day. He has never used smokeless tobacco. He reports that he drinks about 0.6 oz of alcohol per week . He reports that he does not use drugs.  Medications: Medications Prior to Admission  Medication Sig Dispense Refill  . dolutegravir (TIVICAY) 50 MG tablet Take 1 tablet (50 mg total) by mouth daily. 30 tablet 11  . emtricitabine-tenofovir AF (DESCOVY) 200-25 MG tablet Take 1 tablet by mouth daily. 30 tablet 11  . fluticasone (FLONASE) 50 MCG/ACT nasal spray Place 2 sprays into both nostrils daily. 16 g 0  . HYDROcodone-acetaminophen (NORCO/VICODIN) 5-325 MG tablet Take 2 tablets by mouth every 4 (four) hours as needed for moderate pain. 20 tablet 0  . hydrocortisone cream 1 % Apply 1 application topically 2 (two) times daily.    . Multiple Vitamins-Minerals (MULTIVITAMIN GUMMIES ADULT PO) Take by mouth.      No results found for this or any previous visit (from the past 48 hour(s)).  No results found.   A comprehensive review of systems was negative.  Blood pressure 134/74, pulse (!) 49, temperature 97.8 F (36.6 C), resp. rate 18, height 5\' 11"  (1.803 m), weight 91.6 kg (202 lb), SpO2 100 %.  General appearance: alert, cooperative and appears stated age Head: Normocephalic, without  obvious abnormality, atraumatic Neck: supple, symmetrical, trachea midline Resp: clear to auscultation bilaterally Cardio: regular rate and rhythm GI: non-tender Extremities: Intact sensation and capillary refill all digits.  +epl/fpl/io.  No wounds.  Pulses: 2+ and symmetric Skin: Skin color, texture, turgor normal. No rashes or lesions Neurologic: Grossly normal Incision/Wound:none  Assessment/Plan Right ring finger metacarpal fracture.  Non operative and operative treatment options were discussed with the patient and patient wishes to proceed with operative treatment. Risks, benefits, and alternatives of surgery were discussed and the patient agrees with the plan of care.   Marializ Ferrebee R 09/09/2016, 4:27 PM

## 2016-09-09 NOTE — Op Note (Signed)
[id number] 

## 2016-09-09 NOTE — Discharge Instructions (Addendum)

## 2016-09-09 NOTE — Progress Notes (Signed)
Assisted Dr. Foster with right, ultrasound guided, supraclavicular block. Side rails up, monitors on throughout procedure. See vital signs in flow sheet. Tolerated Procedure well. °

## 2016-09-09 NOTE — Anesthesia Procedure Notes (Signed)
Anesthesia Regional Block:  Supraclavicular block  Pre-Anesthetic Checklist: ,, timeout performed, Correct Patient, Correct Site, Correct Laterality, Correct Procedure, Correct Position, site marked, Risks and benefits discussed,  Surgical consent,  Pre-op evaluation,  At surgeon's request and post-op pain management  Laterality: Right  Prep: chloraprep       Needles:  Injection technique: Single-shot  Needle Type: Echogenic Stimulator Needle     Needle Length: 9cm 9 cm Needle Gauge: 21 and 21 G    Additional Needles:  Procedures: ultrasound guided (picture in chart) Supraclavicular block Narrative:  Start time: 09/09/2016 2:18 PM End time: 09/09/2016 2:24 PM Injection made incrementally with aspirations every 5 mL.  Performed by: Personally  Anesthesiologist: Mal AmabileFOSTER, Savi Lastinger  Additional Notes: Timeout performed. Patient sedated. Relevant anatomy ID'd using US. Incremental 5ml injection with frequent aspiration. Patient tolerated procedure well.

## 2016-09-09 NOTE — Anesthesia Preprocedure Evaluation (Signed)
Anesthesia Evaluation  Patient identified by MRN, date of birth, ID band Patient awake    Reviewed: Allergy & Precautions, NPO status , Patient's Chart, lab work & pertinent test results  Airway Mallampati: II  TM Distance: >3 FB Neck ROM: Full    Dental no notable dental hx. (+) Teeth Intact   Pulmonary former smoker,    Pulmonary exam normal breath sounds clear to auscultation       Cardiovascular negative cardio ROS Normal cardiovascular exam Rhythm:Regular Rate:Normal     Neuro/Psych negative neurological ROS  negative psych ROS   GI/Hepatic negative GI ROS, Neg liver ROS,   Endo/Other  negative endocrine ROS  Renal/GU negative Renal ROS  negative genitourinary   Musculoskeletal Fx Right 4th metacarpal   Abdominal   Peds  Hematology  (+) HIV,   Anesthesia Other Findings   Reproductive/Obstetrics                             Anesthesia Physical Anesthesia Plan  ASA: II  Anesthesia Plan: General and Regional   Post-op Pain Management:  Regional for Post-op pain   Induction: Intravenous  Airway Management Planned: LMA  Additional Equipment:   Intra-op Plan:   Post-operative Plan: Extubation in OR  Informed Consent: I have reviewed the patients History and Physical, chart, labs and discussed the procedure including the risks, benefits and alternatives for the proposed anesthesia with the patient or authorized representative who has indicated his/her understanding and acceptance.   Dental advisory given  Plan Discussed with: Anesthesiologist, Surgeon and CRNA  Anesthesia Plan Comments:         Anesthesia Quick Evaluation

## 2016-09-09 NOTE — Brief Op Note (Signed)
09/09/2016  5:35 PM  PATIENT:  Gary Pena  21 y.o. male  PRE-OPERATIVE DIAGNOSIS:  FRACTURE RIGHT RING METACARPAL  POST-OPERATIVE DIAGNOSIS:  FRACTURE RIGHT RING METACARPAL  PROCEDURE:  Procedure(s): OPEN REDUCTION INTERNAL FIXATION (ORIF) METACARPAL, RIGHT RING (Right)  SURGEON:  Surgeon(s) and Role:    * Betha LoaKevin Kruti Horacek, MD - Primary    * Cindee SaltGary Shakyla Nolley, MD - Assisting  PHYSICIAN ASSISTANT:   ASSISTANTS: Cindee SaltGary Tilda Samudio, MD   ANESTHESIA:   regional and general  EBL:  Total I/O In: 1300 [I.V.:1300] Out: -   BLOOD ADMINISTERED:none  DRAINS: none   LOCAL MEDICATIONS USED:  NONE  SPECIMEN:  No Specimen  DISPOSITION OF SPECIMEN:  N/A  COUNTS:  YES  TOURNIQUET:   Total Tourniquet Time Documented: Upper Arm (Right) - 31 minutes Total: Upper Arm (Right) - 31 minutes   DICTATION: .Other Dictation: Dictation Number [id number]  PLAN OF CARE: Discharge to home after PACU  PATIENT DISPOSITION:  PACU - hemodynamically stable.

## 2016-09-09 NOTE — Transfer of Care (Signed)
Immediate Anesthesia Transfer of Care Note  Patient: Gean Birchwoodarahje Niese  Procedure(s) Performed: Procedure(s): OPEN REDUCTION INTERNAL FIXATION (ORIF) METACARPAL, RIGHT RING (Right)  Patient Location: PACU  Anesthesia Type:GA combined with regional for post-op pain  Level of Consciousness: oriented and responds to stimulation  Airway & Oxygen Therapy: Patient Spontanous Breathing and Patient connected to face mask oxygen  Post-op Assessment: Report given to RN and Post -op Vital signs reviewed and stable  Post vital signs: Reviewed and stable  Last Vitals:  Vitals:   09/09/16 1545 09/09/16 1600  BP: (!) 142/72 134/74  Pulse: (!) 53 (!) 49  Resp: (!) 23 18  Temp:      Last Pain:  Vitals:   09/09/16 1600  TempSrc:   PainSc: 0-No pain      Patients Stated Pain Goal: 3 (09/09/16 1600)  Complications: No apparent anesthesia complications

## 2016-09-10 NOTE — Op Note (Signed)
NAMECORMICK, MOSS.:  [id number]  MEDICAL RECORD NO.:  [id number]  LOCATION:                                 FACILITY:  PHYSICIAN:  Gary Loa, MD             DATE OF BIRTH:  DATE OF PROCEDURE:  09/09/2016 DATE OF DISCHARGE:                              OPERATIVE REPORT   PREOPERATIVE DIAGNOSIS:  Right ring finger metacarpal shaft fracture.  POSTOPERATIVE DIAGNOSIS:  Right ring finger metacarpal shaft fracture.  PROCEDURE:  Open reduction internal fixation of right ring finger metacarpal shaft fracture.  SURGEON:  Gary Loa, MD.  ASSISTANT:  Gary Salt, MD.  ANESTHESIA:  General with regional.  IV FLUIDS:  Per anesthesia flow sheet.  ESTIMATED BLOOD LOSS:  Minimal.  COMPLICATIONS:  None.  SPECIMENS:  None.  TOURNIQUET TIME:  31 minutes.  DISPOSITION:  Stable to PACU.  INDICATIONS:  Gary Pena is a 21 year old male, who injured his right hand when he punched a wall.  He was seen in urgent care where radiographs were taken revealing metacarpal fracture.  He was splinted and followed up in the office.  We discussed nonoperative and operative treatment options.  He wished to proceed with operative fixation. Risks, benefits, and alternatives of surgery were discussed including risk of blood loss; infection; damage to nerves, vessels, tendons, ligaments, bone; failure of surgery; need for additional surgery; complications with wound healing; continued pain; nonunion; malunion; stiffness.  He voiced understanding of these risks and elected to proceed.  OPERATIVE COURSE:  After being identified preoperatively by myself, the patient agreed upon procedure and site of procedure.  Surgical site was marked.  The risks, benefits, and alternatives of surgery were reviewed and he wished to proceed.  Surgical consent had been signed.  He was given IV Ancef as preoperative antibiotic prophylaxis.  He was transferred to the operating room and placed on  the operating room table in supine position with the right upper extremity on arm board.  General anesthesia was induced by anesthesiologist.  A regional block had been performed by Anesthesia in preoperative holding.  Right upper extremity was prepped and draped in normal sterile orthopedic fashion.  Surgical pause was performed between surgeons, anesthesia, and operating staff; and all were in agreement as to the patient, procedure, and site of procedure.  Tourniquet at the proximal aspect of the extremity was inflated to 250 mmHg after exsanguination of the limb with an Esmarch bandage.  Incision was made on dorsum of the hand over the ring finger metacarpal and carried into subcutaneous tissues by spreading technique. Bipolar electrocautery was used to obtain hemostasis.  Dorsal branches of the ulnar sensory nerve were identified and protected.  The extensor tendons were retracted.  The periosteum was sharply incised with knife and elevated with periosteal elevator.  The fracture site was identified.  It was cleared of clot formation.  It was reduced under direct visualization.  It was held with a clamp.  A standard AO drilling and measuring technique was used.  Three 1.5 mm screws from the ALTS set were used.  Good purchase was obtained.  C-arm was used in AP, lateral, and oblique projections to ensure appropriate reduction and position of the hardware, which was the case.  The wrist was placed through tenodesis and there was no scissoring of the fingers.  The wound was copiously irrigated with sterile saline.  The periosteum was repaired back over top of the plate as best possible with 4-0 Vicryl suture.  The skin was closed with 4-0 nylon in a horizontal mattress fashion.  The wound was dressed with sterile Xeroform, 4x4s, and wrapped with a Kerlix bandage.  Volar and dorsal slab splint including the long, ring, and small fingers was placed with the MPs flexed and IPs extended.   This was wrapped with Kerlix and Ace bandage.  Tourniquet was deflated at 31 minutes.  Fingertips were pink with brisk capillary refill after deflation of tourniquet.  Operative drapes were broken down.  The patient was awoken from anesthesia safely.  He was transferred back to stretcher and taken to PACU in stable condition.  I will see him back in the office 1 week for postoperative followup.  He will use Percocet 5/325 one to two p.o. q.6 hours p.r.n. pain, dispensed #30.     Gary LoaKevin Tylan Kinn, MD     KK/MEDQ  D:  09/09/2016  T:  09/10/2016  Job:  [id number]

## 2016-09-10 NOTE — Anesthesia Postprocedure Evaluation (Signed)
Anesthesia Post Note  Patient: Gary Pena  Procedure(s) Performed: Procedure(s) (LRB): OPEN REDUCTION INTERNAL FIXATION (ORIF) METACARPAL, RIGHT RING (Right)  Patient location during evaluation: PACU Anesthesia Type: General Level of consciousness: awake and alert Pain management: pain level controlled Vital Signs Assessment: post-procedure vital signs reviewed and stable Respiratory status: spontaneous breathing, nonlabored ventilation, respiratory function stable and patient connected to nasal cannula oxygen Cardiovascular status: blood pressure returned to baseline and stable Postop Assessment: no signs of nausea or vomiting Anesthetic complications: no       Last Vitals:  Vitals:   09/09/16 1800 09/09/16 1812  BP: 128/75 (!) 101/59  Pulse: (!) 52 (!) 55  Resp: 15 18  Temp:  36.6 C    Last Pain:  Vitals:   09/09/16 1812  TempSrc: Oral  PainSc: 0-No pain                 Cecile HearingStephen Edward Turk

## 2016-09-12 ENCOUNTER — Encounter (HOSPITAL_BASED_OUTPATIENT_CLINIC_OR_DEPARTMENT_OTHER): Payer: Self-pay | Admitting: Orthopedic Surgery

## 2016-09-24 ENCOUNTER — Other Ambulatory Visit (HOSPITAL_COMMUNITY)
Admission: RE | Admit: 2016-09-24 | Discharge: 2016-09-24 | Disposition: A | Discharge status: Home / Self Care | Payer: BLUE CROSS/BLUE SHIELD | Source: Ambulatory Visit | Attending: Internal Medicine | Admitting: Internal Medicine

## 2016-09-24 ENCOUNTER — Other Ambulatory Visit (INDEPENDENT_AMBULATORY_CARE_PROVIDER_SITE_OTHER): Payer: BLUE CROSS/BLUE SHIELD

## 2016-09-24 DIAGNOSIS — B20 Human immunodeficiency virus [HIV] disease: Secondary | ICD-10-CM

## 2016-09-24 DIAGNOSIS — Z113 Encounter for screening for infections with a predominantly sexual mode of transmission: Secondary | ICD-10-CM

## 2016-09-24 LAB — COMPLETE METABOLIC PANEL WITH GFR
ALT: 18 U/L (ref 9–46)
AST: 15 U/L (ref 10–40)
Albumin: 4.5 g/dL (ref 3.6–5.1)
Alkaline Phosphatase: 36 U/L — ABNORMAL LOW (ref 40–115)
BUN: 12 mg/dL (ref 7–25)
CHLORIDE: 106 mmol/L (ref 98–110)
CO2: 28 mmol/L (ref 20–31)
CREATININE: 1.03 mg/dL (ref 0.60–1.35)
Calcium: 9.4 mg/dL (ref 8.6–10.3)
GFR, Est African American: >89 mL/min (ref >=60)
Glucose, Bld: 83 mg/dL (ref 65–99)
POTASSIUM: 4.4 mmol/L (ref 3.5–5.3)
Sodium: 140 mmol/L (ref 135–146)
Total Bilirubin: 0.7 mg/dL (ref 0.2–1.2)
Total Protein: 7.4 g/dL (ref 6.1–8.1)

## 2016-09-24 LAB — CBC WITH DIFFERENTIAL/PLATELET
BASOS PCT: 0 %
Basophils Absolute: 0 cells/uL (ref 0–200)
EOS ABS: 110 {cells}/uL (ref 15–500)
Eosinophils Relative: 2 %
HCT: 44.9 % (ref 38.5–50.0)
Hemoglobin: 14.4 g/dL (ref 13.2–17.1)
LYMPHS PCT: 39 %
Lymphs Abs: 2145 cells/uL (ref 850–3900)
MCH: 26.5 pg — ABNORMAL LOW (ref 27.0–33.0)
MCHC: 32.1 g/dL (ref 32.0–36.0)
MCV: 82.7 fL (ref 80.0–100.0)
MONOS PCT: 9 %
MPV: 10.6 fL (ref 7.5–12.5)
Monocytes Absolute: 495 cells/uL (ref 200–950)
NEUTROS PCT: 50 %
Neutro Abs: 2750 cells/uL (ref 1500–7800)
PLATELETS: 206 10*3/uL (ref 140–400)
RBC: 5.43 MIL/uL (ref 4.20–5.80)
RDW: 13.8 % (ref 11.0–15.0)
WBC: 5.5 10*3/uL (ref 3.8–10.8)

## 2016-09-24 LAB — T-HELPER CELL (CD4) - (RCID CLINIC ONLY)
CD4 % Helper T Cell: 36 % (ref 33–55)
CD4 T CELL ABS: 840 /uL (ref 400–2700)

## 2016-09-27 LAB — HIV-1 RNA QUANT-NO REFLEX-BLD
HIV 1 RNA QUANT: NOT DETECTED {copies}/mL
HIV-1 RNA QUANT, LOG: NOT DETECTED {Log_copies}/mL

## 2016-09-27 LAB — URINE CYTOLOGY ANCILLARY ONLY
CHLAMYDIA, DNA PROBE: NEGATIVE
NEISSERIA GONORRHEA: NEGATIVE

## 2016-10-12 ENCOUNTER — Ambulatory Visit (INDEPENDENT_AMBULATORY_CARE_PROVIDER_SITE_OTHER): Payer: BLUE CROSS/BLUE SHIELD | Admitting: Internal Medicine

## 2016-10-12 ENCOUNTER — Encounter: Payer: Self-pay | Admitting: Internal Medicine

## 2016-10-12 VITALS — BP 132/73 | HR 61 | Temp 98.5°F | Wt 218.1 lb

## 2016-10-12 DIAGNOSIS — Z8619 Personal history of other infectious and parasitic diseases: Secondary | ICD-10-CM | POA: Diagnosis not present

## 2016-10-12 DIAGNOSIS — S6991XD Unspecified injury of right wrist, hand and finger(s), subsequent encounter: Secondary | ICD-10-CM

## 2016-10-12 DIAGNOSIS — B20 Human immunodeficiency virus [HIV] disease: Secondary | ICD-10-CM | POA: Diagnosis not present

## 2016-10-12 NOTE — Progress Notes (Signed)
RFV: follow up for HIV disease  Patient ID: Gary Pena, male   DOB: 08/13/1995, 21 y.o.   MRN: [id number]  HPI Gary Pena is a 21yo M with well controlled HIV disease. Cd 4 count of 840/VL<20 on tivicay/descovy who is doing well with adherence. Gary Pena is finishing college in may and anticipating to move to [place] for further job/schooling in July.  Gary Pena injured his right hand in February requiring surgery. Healing well. Otherwise has been in good state of health  Outpatient Encounter Prescriptions as of 10/12/2016  Medication Sig  . dolutegravir (TIVICAY) 50 MG tablet Take 1 tablet (50 mg total) by mouth daily.  Marland Kitchen emtricitabine-tenofovir AF (DESCOVY) 200-25 MG tablet Take 1 tablet by mouth daily.  . fluticasone (FLONASE) 50 MCG/ACT nasal spray Place 2 sprays into both nostrils daily.  . hydrocortisone cream 1 % Apply 1 application topically 2 (two) times daily.  . Multiple Vitamins-Minerals (MULTIVITAMIN GUMMIES ADULT PO) Take by mouth.  Marland Kitchen HYDROcodone-acetaminophen (NORCO/VICODIN) 5-325 MG tablet Take 2 tablets by mouth every 4 (four) hours as needed for moderate pain. (Patient not taking: Reported on 10/12/2016)  . oxyCODONE-acetaminophen (PERCOCET) 5-325 MG tablet 1-2 tabs PO q6 hours prn pain (Patient not taking: Reported on 10/12/2016)   No facility-administered encounter medications on file as of 10/12/2016.      Patient Active Problem List   Diagnosis Date Noted  . Human immunodeficiency virus (HIV) disease 05/01/2013  . Syphilis 05/01/2013     Health Maintenance Due  Topic Date Due  . TETANUS/TDAP  04/26/2014     Review of Systems Review of Systems  Constitutional: Negative for fever, chills, diaphoresis, activity change, appetite change, fatigue and unexpected weight change.  HENT: Negative for congestion, sore throat, rhinorrhea, sneezing, trouble swallowing and sinus pressure.  Eyes: Negative for photophobia and visual disturbance.  Respiratory: Negative for cough, chest  tightness, shortness of breath, wheezing and stridor.  Cardiovascular: Negative for chest pain, palpitations and leg swelling.  Gastrointestinal: Negative for nausea, vomiting, abdominal pain, diarrhea, constipation, blood in stool, abdominal distention and anal bleeding.  Genitourinary: Negative for dysuria, hematuria, flank pain and difficulty urinating.  Musculoskeletal: Negative for myalgias, back pain, joint swelling, arthralgias and gait problem.  Skin: Negative for color change, pallor, rash and wound.  Neurological: Negative for dizziness, tremors, weakness and light-headedness.  Hematological: Negative for adenopathy. Does not bruise/bleed easily.  Psychiatric/Behavioral: Negative for behavioral problems, confusion, sleep disturbance, dysphoric mood, decreased concentration and agitation.    Physical Exam   BP 132/73   Pulse 61   Temp 98.5 F (36.9 C) (Oral)   Wt 218 lb 1.9 oz (98.9 kg)   SpO2 100%   BMI 30.42 kg/m   Physical Exam  Constitutional: Gary Pena is oriented to person, place, and time. Gary Pena appears well-developed and well-nourished. No distress.  HENT:  Mouth/Throat: Oropharynx is clear and moist. No oropharyngeal exudate.  Cardiovascular: Normal rate, regular rhythm and normal heart sounds. Exam reveals no gallop and no friction rub.  No murmur heard.  Pulmonary/Chest: Effort normal and breath sounds normal. No respiratory distress. Gary Pena has no wheezes.  Abdominal: Soft. Bowel sounds are normal. Gary Pena exhibits no distension. There is no tenderness.  Lymphadenopathy:  Gary Pena has no cervical adenopathy.  Neurological: Gary Pena is alert and oriented to person, place, and time.  Skin: Skin is warm and dry. No rash noted. No erythema.  Psychiatric: Gary Pena has a normal mood and affect. His behavior is normal.    Lab Results  Component Value Date  CD4TCELL 36 09/24/2016   Lab Results  Component Value Date   CD4TABS 840 09/24/2016   CD4TABS 650 03/24/2016   CD4TABS 940 05/22/2015    Lab Results  Component Value Date   HIV1RNAQUANT <20 NOT DETECTED 09/24/2016   Lab Results  Component Value Date   HEPBSAB POS (A) 04/19/2013   Lab Results  Component Value Date   LABRPR NON REAC 03/24/2016    CBC Lab Results  Component Value Date   WBC 5.5 09/24/2016   RBC 5.43 09/24/2016   HGB 14.4 09/24/2016   HCT 44.9 09/24/2016   PLT 206 09/24/2016   MCV 82.7 09/24/2016   MCH 26.5 (L) 09/24/2016   MCHC 32.1 09/24/2016   RDW 13.8 09/24/2016   LYMPHSABS 2,145 09/24/2016   MONOABS 495 09/24/2016   EOSABS 110 09/24/2016    BMET Lab Results  Component Value Date   NA 140 09/24/2016   K 4.4 09/24/2016   CL 106 09/24/2016   CO2 28 09/24/2016   GLUCOSE 83 09/24/2016   BUN 12 09/24/2016   CREATININE 1.03 09/24/2016   CALCIUM 9.4 09/24/2016   GFRNONAA >89 09/24/2016   GFRAA >89 09/24/2016      Assessment and Plan  hiv disease=  Well controlled. Continue on current regimen.  Hand injury = improving. Well healed incision still has slight swelling at surgical site at dorsum of hand but has full range of motion of fingers and grip strength. Use ibuprofen prn for pain  Health maintenance =will give meningococcal in June.   Hx of syphilis = sti screening negative  Drug monitoring = cr function is stable

## 2016-12-28 ENCOUNTER — Ambulatory Visit: Payer: BLUE CROSS/BLUE SHIELD | Admitting: Internal Medicine

## 2017-10-07 IMAGING — DX DG HAND COMPLETE 3+V*R*
3 series · 3 of 3 positions shown · non-contrast
Comparison: None.

CLINICAL DATA: Punched wall with right hand with pain, initial
encounter

EXAM:
RIGHT HAND - COMPLETE 3+ VIEW

[hand pa]
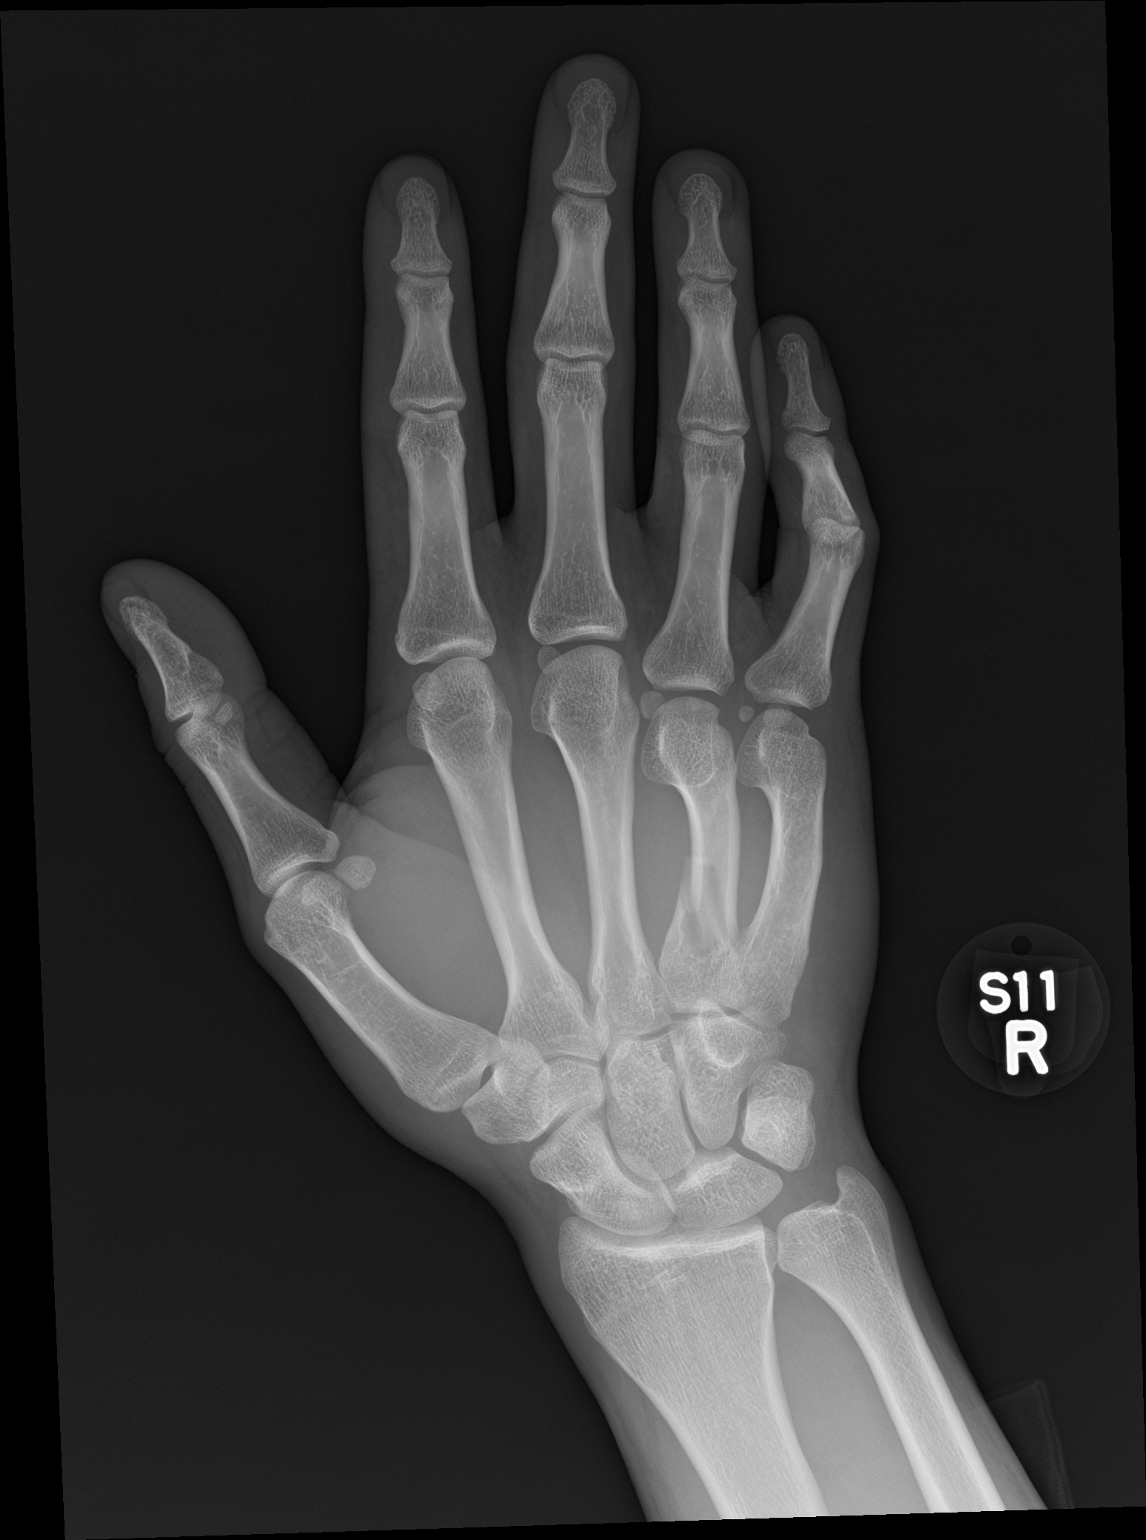

[hand obl]
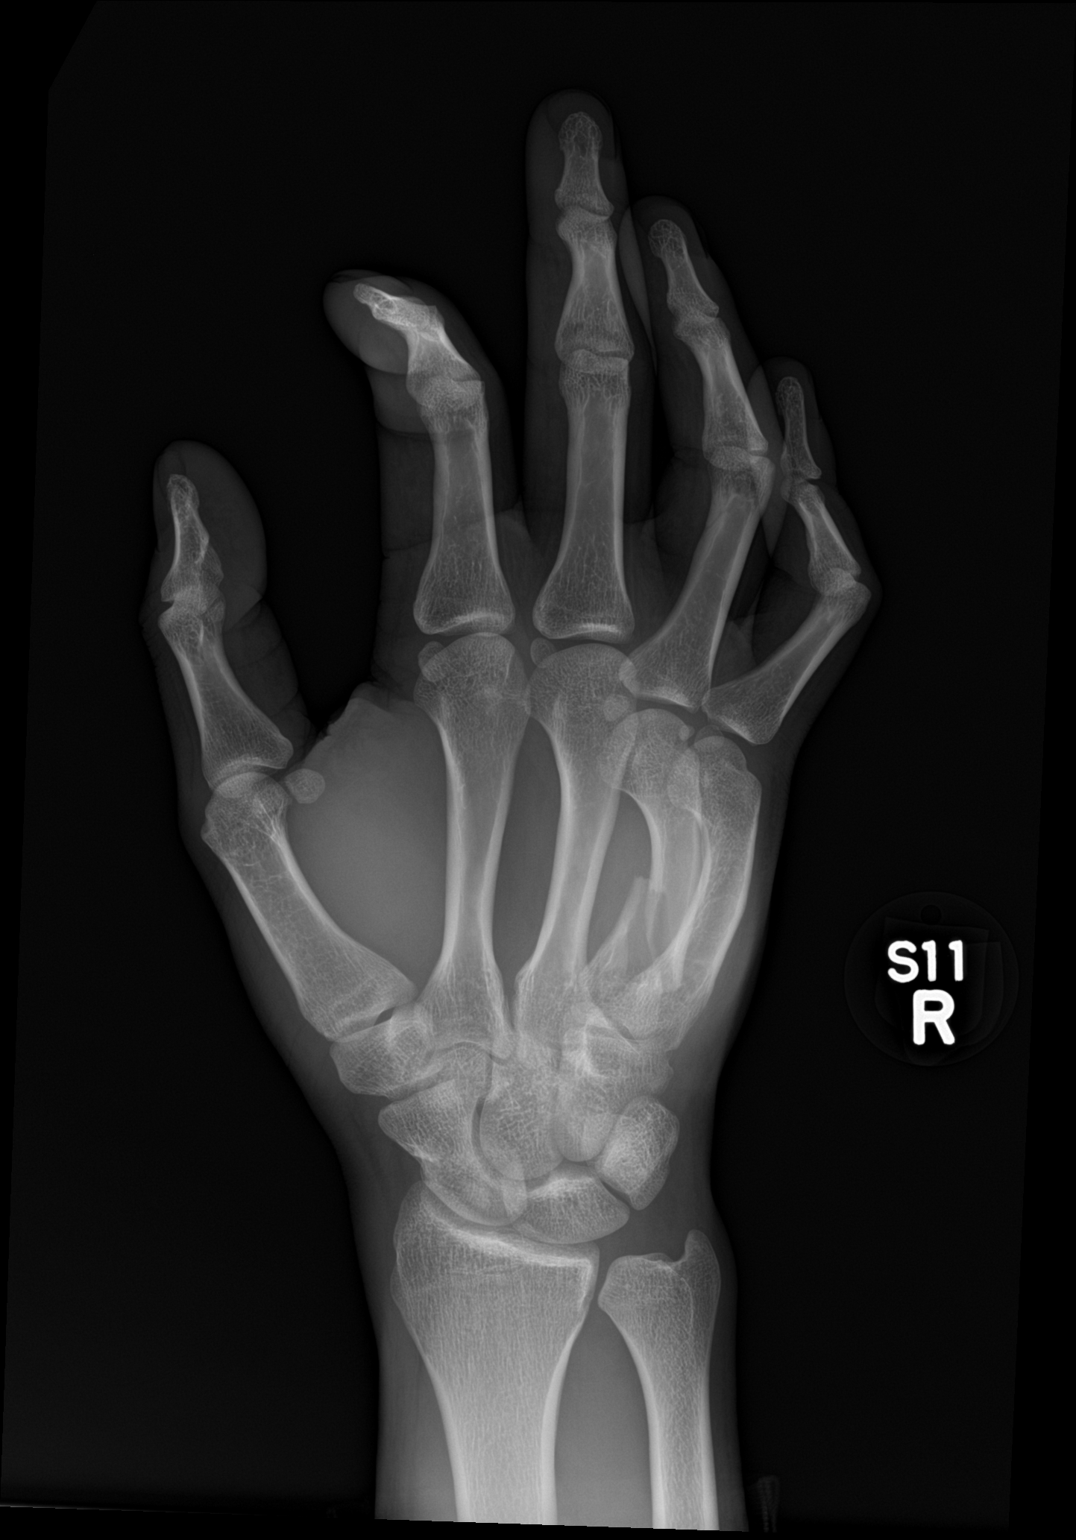

[hand lat]
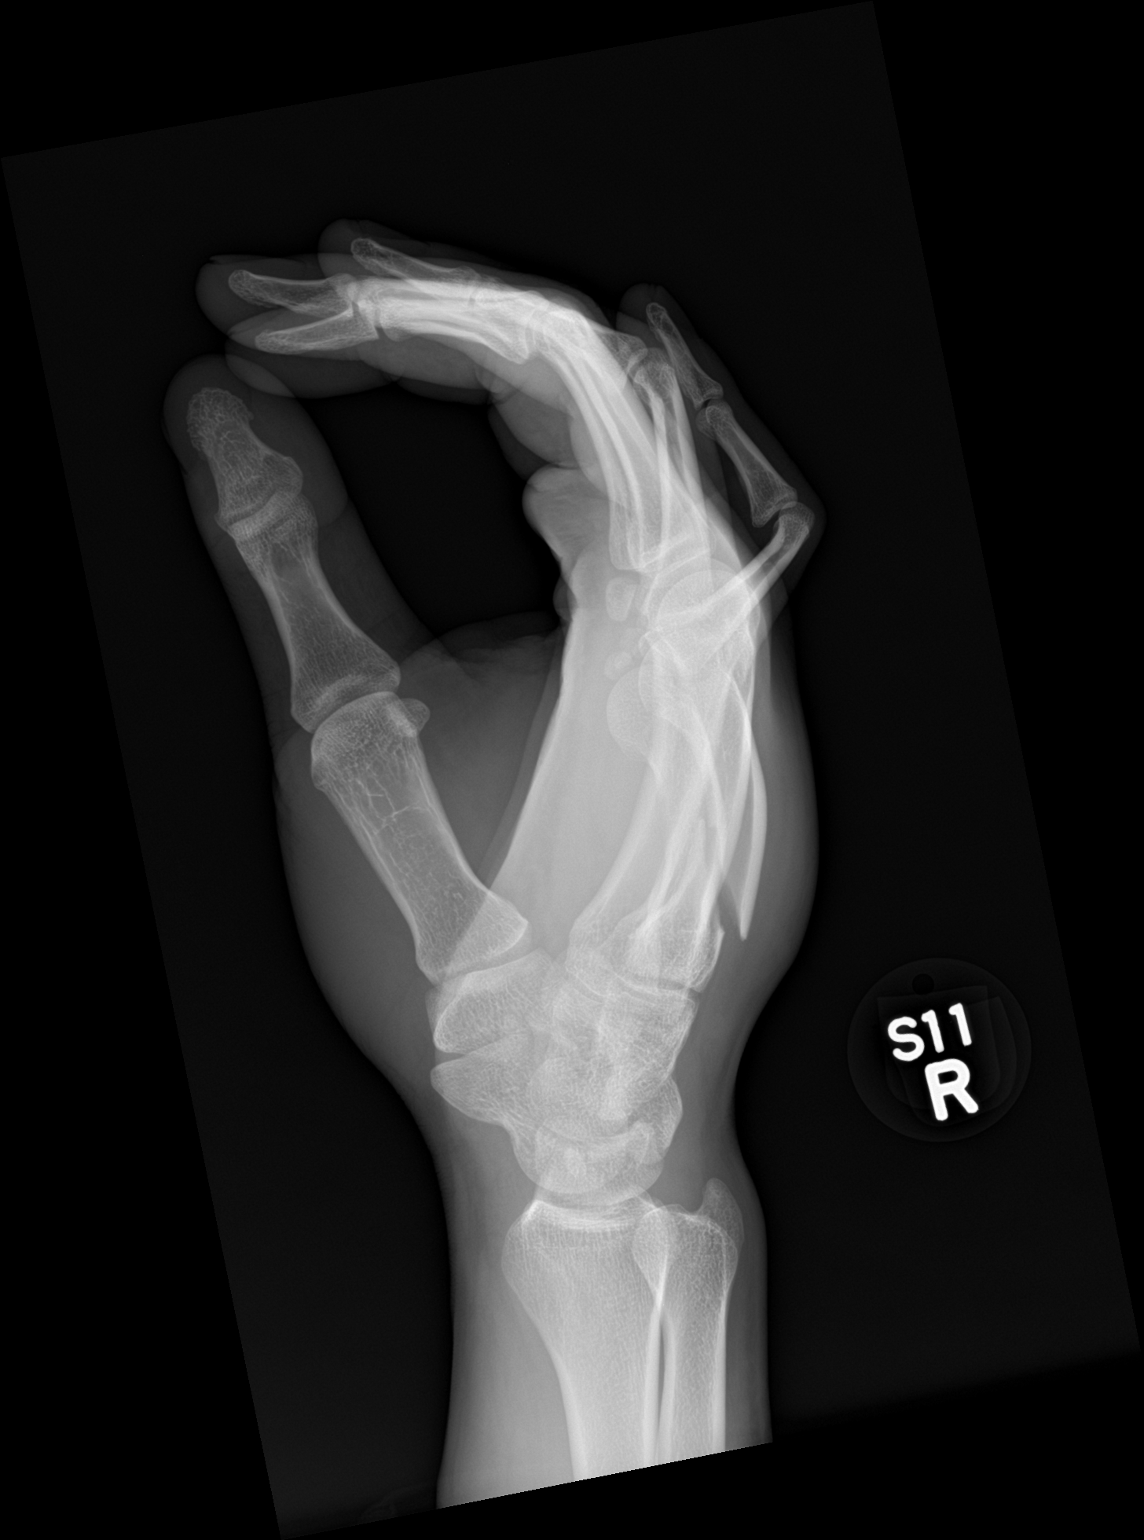

[3 of 3 positions shown; findings below may reference images not displayed]

FINDINGS: There is an oblique fracture through the midshaft of the fourth
metacarpal with mild angulation at the fracture site. Curvature of
the fifth metacarpal is noted likely related to prior fracture with
healing. No other focal abnormality is seen.
IMPRESSION: Fourth metacarpal fracture.

Changes consistent with prior healed fifth metacarpal fracture.
